# Patient Record
Sex: Male | Born: 1935 | Race: White | Hispanic: No | Marital: Married | Smoking: Former smoker
Health system: Southern US, Community
[De-identification: ages and names within clinical notes are randomized; demographics above are authoritative.]

## PROBLEM LIST (undated history)

## (undated) DIAGNOSIS — F0390 Unspecified dementia without behavioral disturbance: Secondary | ICD-10-CM

## (undated) DIAGNOSIS — I714 Abdominal aortic aneurysm, without rupture, unspecified: Secondary | ICD-10-CM

## (undated) DIAGNOSIS — I739 Peripheral vascular disease, unspecified: Secondary | ICD-10-CM

## (undated) DIAGNOSIS — G20A1 Parkinson's disease without dyskinesia, without mention of fluctuations: Secondary | ICD-10-CM

## (undated) DIAGNOSIS — I10 Essential (primary) hypertension: Secondary | ICD-10-CM

## (undated) DIAGNOSIS — E785 Hyperlipidemia, unspecified: Secondary | ICD-10-CM

## (undated) DIAGNOSIS — I2510 Atherosclerotic heart disease of native coronary artery without angina pectoris: Secondary | ICD-10-CM

## (undated) DIAGNOSIS — G20 Parkinson's disease: Secondary | ICD-10-CM

## (undated) HISTORY — DX: Essential (primary) hypertension: I10

## (undated) HISTORY — DX: Hyperlipidemia, unspecified: E78.5

## (undated) HISTORY — DX: Atherosclerotic heart disease of native coronary artery without angina pectoris: I25.10

## (undated) HISTORY — DX: Peripheral vascular disease, unspecified: I73.9

## (undated) HISTORY — DX: Abdominal aortic aneurysm, without rupture, unspecified: I71.40

## (undated) HISTORY — PX: ABDOMINAL AORTIC ANEURYSM REPAIR: SUR1152

## (undated) HISTORY — DX: Abdominal aortic aneurysm, without rupture: I71.4

---

## 2004-01-28 ENCOUNTER — Ambulatory Visit: Payer: Self-pay | Admitting: Internal Medicine

## 2004-01-29 ENCOUNTER — Ambulatory Visit: Payer: Self-pay | Admitting: *Deleted

## 2004-02-01 ENCOUNTER — Ambulatory Visit: Payer: Self-pay | Admitting: *Deleted

## 2004-12-30 ENCOUNTER — Ambulatory Visit: Payer: Self-pay | Admitting: *Deleted

## 2005-02-06 ENCOUNTER — Ambulatory Visit: Payer: Self-pay | Admitting: *Deleted

## 2005-08-26 ENCOUNTER — Emergency Department (HOSPITAL_COMMUNITY): Admission: EM | Admit: 2005-08-26 | Discharge: 2005-08-26 | Payer: Self-pay | Admitting: Emergency Medicine

## 2006-01-26 ENCOUNTER — Ambulatory Visit: Payer: Self-pay | Admitting: *Deleted

## 2006-02-04 ENCOUNTER — Ambulatory Visit: Payer: Self-pay

## 2007-01-03 ENCOUNTER — Ambulatory Visit: Payer: Self-pay | Admitting: Cardiology

## 2008-01-10 ENCOUNTER — Ambulatory Visit: Payer: Self-pay | Admitting: Cardiology

## 2009-03-25 ENCOUNTER — Ambulatory Visit: Payer: Self-pay | Admitting: Cardiology

## 2009-03-25 DIAGNOSIS — I10 Essential (primary) hypertension: Secondary | ICD-10-CM

## 2009-03-25 DIAGNOSIS — I2510 Atherosclerotic heart disease of native coronary artery without angina pectoris: Secondary | ICD-10-CM

## 2009-03-25 DIAGNOSIS — R259 Unspecified abnormal involuntary movements: Secondary | ICD-10-CM | POA: Insufficient documentation

## 2009-03-25 DIAGNOSIS — E785 Hyperlipidemia, unspecified: Secondary | ICD-10-CM

## 2009-03-26 ENCOUNTER — Telehealth: Payer: Self-pay | Admitting: Cardiology

## 2009-06-12 ENCOUNTER — Encounter: Payer: Self-pay | Admitting: Cardiology

## 2009-10-11 ENCOUNTER — Encounter: Payer: Self-pay | Admitting: Cardiology

## 2010-03-27 ENCOUNTER — Encounter: Payer: Self-pay | Admitting: Cardiology

## 2010-03-27 ENCOUNTER — Ambulatory Visit
Admission: RE | Admit: 2010-03-27 | Discharge: 2010-03-27 | Payer: Self-pay | Source: Home / Self Care | Attending: Cardiology | Admitting: Cardiology

## 2010-04-01 ENCOUNTER — Other Ambulatory Visit: Payer: Self-pay | Admitting: Cardiology

## 2010-04-01 ENCOUNTER — Ambulatory Visit
Admission: RE | Admit: 2010-04-01 | Discharge: 2010-04-01 | Payer: Self-pay | Source: Home / Self Care | Attending: Cardiology | Admitting: Cardiology

## 2010-04-01 LAB — LIPID PANEL
HDL: 34.3 mg/dL — ABNORMAL LOW (ref >39.00)
LDL Cholesterol: 70 mg/dL (ref 0–99)
Triglycerides: 123 mg/dL (ref 0.0–149.0)

## 2010-04-03 NOTE — Progress Notes (Signed)
Summary: pt needs referal   Phone Note Call from Patient Call back at Home Phone [phone]   Caller: Spouse Reason for Call: Talk to Nurse, Talk to Doctor Summary of Call: The MD that the patient was refered to for the shaking of his hands wont schedule appt without referal and records from Dr. Antoine Poche spouse will be leaving at 11:30 this morning please try to call prior to that she wants to talk to you Initial call taken by: Omer Jack,  March 26, 2009 8:37 AM  Follow-up for Phone Call        left hand tremor/shuffling feet and memory bad.  Alexander Novak (wife) would like for scheduler to talk to her about the appointment do to pt's poor memory   Follow-up by: Charolotte Capuchin, RN,  March 26, 2009 9:23 AM

## 2010-04-03 NOTE — Assessment & Plan Note (Signed)
Summary: yearly/sl  Medications Added VITAMIN C 1000 MG TABS (ASCORBIC ACID) 4  by mouth daily FISH OIL   OIL (FISH OIL) 2 by mouth daily ASPIRIN 81 MG  TABS (ASPIRIN) 1 by mouth daily AZILECT 1 MG TABS (RASAGILINE MESYLATE) 1 by mouth daily NAMENDA 10 MG TABS (MEMANTINE HCL) 2 by mouth daily      Allergies Added: NKDA  Visit Type:  Follow-up Primary Provider:  Dr. Purnell Shoemaker  CC:  CAD.  History of Present Illness: The patient returns for yearly followup. Since I last saw him he has done well. He denies any cardiovascular symptoms. He has had no chest pressure, neck or arm discomfort. He has had no palpitations, presyncope or syncope. He has had no shortness of breath, PND or orthopnea. He denies weight gain or edema.  Current Medications (verified): 1)  Simvastatin 40 Mg Tabs (Simvastatin) .Marland Kitchen.. 1 By Mouth Daily 2)  Vitamin D 1000 Unit  Tabs (Cholecalciferol) .... 2 By Mouth Daily 3)  Lysine Hcl 1000 Mg Tabs (Lysine Hcl) .... 2 By Mouth Daily 4)  Magnesium 250 Mg Tabs (Magnesium) .Marland Kitchen.. 1 By Mouth Daily 5)  Vitamin E 400 Unit Caps (Vitamin E) .Marland Kitchen.. 1 By Mouth Daily 6)  Ginkoba 40 Mg Tabs (Ginkgo Biloba) .... Daily 7)  Co Q-10 30 Mg  Caps (Coenzyme Q10) .Marland Kitchen.. 1 By Mouth Daily 8)  L- Carnitine .Marland Kitchen.. 1 By Mouth Daily 9)  Fish Oil   Oil (Fish Oil) .Marland Kitchen.. 1 By Mouth Daily 10)  Vitamin B Complex-C   Caps (B Complex-C) .Marland Kitchen.. 1 By Mouth Daily 11)  Vitamin C 1000 Mg Tabs (Ascorbic Acid) .... 4  By Mouth Daily 12)  Fish Oil   Oil (Fish Oil) .... 2 By Mouth Daily 13)  Aspirin 81 Mg  Tabs (Aspirin) .Marland Kitchen.. 1 By Mouth Daily 14)  Azilect 1 Mg Tabs (Rasagiline Mesylate) .Marland Kitchen.. 1 By Mouth Daily 15)  Namenda 10 Mg Tabs (Memantine Hcl) .... 2 By Mouth Daily  Allergies (verified): No Known Drug Allergies  Past History:  Past Medical History: Reviewed history from 03/25/2009 and no changes required. 1. Coronary artery disease (acute inferior myocardial infarction in     1992.  He had a PTCA and was treated  for recurrent symptoms in     1997.  He had a distal occluded AV circumflex.  He had proximal 95%     stenosis in the circumflex, which was treated with angioplasty). 2. Bilateral femoral artery occlusion, managed medically. 3. Hypertension. 4. Hyperlipidemia.  Past Surgical History: Reviewed history from 03/25/2009 and no changes required.  Abdominal aortic aneurysm, repaired by Dr. Madilyn Fireman.   Review of Systems       As stated in the HPI and negative for all other systems.   Vital Signs:  Patient profile:   77 year old male Height:      70 inches Weight:      195 pounds BMI:     28.08 Pulse rate:   76 / minute Resp:     16 per minute BP sitting:   132 / 74  (right arm)  Vitals Entered By: Marrion Coy, CNA (March 27, 2010 2:19 PM)  Physical Exam  General:  Well developed, well nourished, in no acute distress. Head:  normocephalic and atraumatic Eyes:  PERRLA/EOM intact; conjunctiva and lids normal. Mouth:  edentulous, gums and palate normal. Oral mucosa normal. Neck:  Neck supple, no JVD. No masses, thyromegaly or abnormal cervical nodes. Chest Wall:  no deformities  Lungs:  Clear bilaterally to auscultation and percussion. Abdomen:  Well-healed surgical scar with slight surgical scar hernia, positive bowel sounds, no rebound, guarding, no hepatomegaly. Msk:  Back normal, normal gait. Muscle strength and tone normal. Extremities:  No clubbing or cyanosis. Neurologic:  Alert and oriented x 3. Skin:  Intact without lesions or rashes. Cervical Nodes:  no significant adenopathy Inguinal Nodes:  no significant adenopathy Psych:  Normal affect.   Detailed Cardiovascular Exam  Neck    Carotids: Carotids full and equal bilaterally without bruits.      Neck Veins: Normal, no JVD.    Heart    Inspection: no deformities or lifts noted.      Palpation: normal PMI with no thrills palpable.      Auscultation: regular rate and rhythm, S1, S2 without murmurs, rubs, gallops,  or clicks.    Vascular    Abdominal Aorta: no palpable masses, pulsations, or audible bruits.      Femoral Pulses: normal femoral pulses bilaterally.      Radial Pulses: normal radial pulses bilaterally.      Peripheral Circulation: no clubbing, cyanosis, or edema noted with normal capillary refill.     EKG  Procedure date:  03/27/2010  Findings:      sinus rhythm, rate 76, axis within normal limits, inferolateral Q waves, old infarct, inferior T-wave inversion  Impression & Recommendations:  Problem # 1:  CORONARY ATHEROSCLEROSIS NATIVE CORONARY ARTERY (ICD-414.01) He is doing well and has no ongoing angina. No change in therapy is indicated.  Problem # 2:  DYSLIPIDEMIA (ICD-272.4) I will check a lipid profile with a goal LDL less than 100 HDL greater than 40.  Problem # 3:  ESSENTIAL HYPERTENSION, BENIGN (ICD-401.1) His blood pressure is 12 and medicines as listed.  Patient Instructions: 1)  Your physician recommends that you return for a FASTING lipid profile: 414.01 2)  Your physician wants you to follow-up in: 1 year with Dr Neena Rhymes 2013)   You will receive a reminder letter in the mail two months in advance. If you don't receive a letter, please call our office to schedule the follow-up appointment.

## 2010-04-03 NOTE — Letter (Signed)
Summary: [facility]   [facility]   Imported By: Roderic Ovens 10/21/2009 10:23:41  _____________________________________________________________________  External Attachment:    Type:   Image     Comment:   External Document

## 2010-04-03 NOTE — Letter (Signed)
Summary: [facility]   [facility]   Imported By: Roderic Ovens 06/19/2009 10:00:11  _____________________________________________________________________  External Attachment:    Type:   Image     Comment:   External Document

## 2010-04-03 NOTE — Assessment & Plan Note (Signed)
Summary: rov per pt call/lg  Medications Added VITAMIN D 1000 UNIT  TABS (CHOLECALCIFEROL) 2 by mouth daily LYSINE HCL 1000 MG TABS (LYSINE HCL) 2 by mouth daily MAGNESIUM 250 MG TABS (MAGNESIUM) 1 by mouth daily VITAMIN E 400 UNIT CAPS (VITAMIN E) 1 by mouth daily GINKOBA 40 MG TABS (GINKGO BILOBA) daily CO Q-10 30 MG  CAPS (COENZYME Q10) 1 by mouth daily * L- CARNITINE 1 by mouth daily FISH OIL   OIL (FISH OIL) 1 by mouth daily VITAMIN B COMPLEX-C   CAPS (B COMPLEX-C) 1 by mouth daily      Allergies Added: NKDA  Visit Type:  Follow-up Primary Provider:  Dr. Purnell Shoemaker  CC:  CAD.  History of Present Illness: The patient sensory followup. It has been one year since his last appointment. His last stress test was in 2007 demonstrating an old infarct but no ischemia. He has done quite well over the past year. He does some activities and says he could walk a football field or climb stairs without chest pain or shortness of breath. He has had no palpitations, presyncope or syncope. He has had no PND or orthopnea. He has had no weight gain or swelling. He does have gas and burping and has seen primary care physicians about this and treats this with over-the-counter medications.  Current Medications (verified): 1)  Simvastatin 40 Mg Tabs (Simvastatin) .Marland Kitchen.. 1 By Mouth Daily 2)  Vitamin D 1000 Unit  Tabs (Cholecalciferol) .... 2 By Mouth Daily 3)  Lysine Hcl 1000 Mg Tabs (Lysine Hcl) .... 2 By Mouth Daily 4)  Magnesium 250 Mg Tabs (Magnesium) .Marland Kitchen.. 1 By Mouth Daily 5)  Vitamin E 400 Unit Caps (Vitamin E) .Marland Kitchen.. 1 By Mouth Daily 6)  Ginkoba 40 Mg Tabs (Ginkgo Biloba) .... Daily 7)  Co Q-10 30 Mg  Caps (Coenzyme Q10) .Marland Kitchen.. 1 By Mouth Daily 8)  L- Carnitine .Marland Kitchen.. 1 By Mouth Daily 9)  Fish Oil   Oil (Fish Oil) .Marland Kitchen.. 1 By Mouth Daily 10)  Vitamin B Complex-C   Caps (B Complex-C) .Marland Kitchen.. 1 By Mouth Daily  Allergies (verified): No Known Drug Allergies  Past History:  Past Medical History: 1. Coronary  artery disease (acute inferior myocardial infarction in     1992.  He had a PTCA and was treated for recurrent symptoms in     1997.  He had a distal occluded AV circumflex.  He had proximal 95%     stenosis in the circumflex, which was treated with angioplasty). 2. Bilateral femoral artery occlusion, managed medically. 3. Hypertension. 4. Hyperlipidemia.  Past Surgical History:  Abdominal aortic aneurysm, repaired by Dr. Madilyn Fireman.   Review of Systems       Positve for fine resting tremor, umbilical hernia his previous incision site. Otherwise as stated in the history of present illness negative for all other systems.  Vital Signs:  Patient profile:   77 year old male Height:      70 inches Weight:      198 pounds BMI:     28.51 Pulse rate:   65 / minute Resp:     16 per minute BP sitting:   135 / 81  (right arm)  Vitals Entered By: Marrion Coy, CNA (March 25, 2009 3:47 PM)  Physical Exam  General:  Well developed, well nourished, in no acute distress. Head:  normocephalic and atraumatic Eyes:  PERRLA/EOM intact; conjunctiva and lids normal. Mouth:  edentulous, gums and palate normal. Oral mucosa normal. Neck:  Neck supple, no JVD. No masses, thyromegaly or abnormal cervical nodes. Chest Wall:  no deformities or breast masses noted Lungs:  Clear bilaterally to auscultation and percussion. Abdomen:  Well-healed surgical scar with slight surgical scar hernia, positive bowel sounds, no rebound, guarding, no hepatomegaly. Msk:  Back normal, normal gait. Muscle strength and tone normal. Extremities:  No clubbing or cyanosis. Neurologic:  Alert and oriented x 3. Skin:  Intact without lesions or rashes. Cervical Nodes:  no significant adenopathy Axillary Nodes:  no significant adenopathy Inguinal Nodes:  no significant adenopathy Psych:  Normal affect.   Detailed Cardiovascular Exam  Neck    Carotids: Carotids full and equal bilaterally without bruits.      Neck Veins:  Normal, no JVD.    Heart    Inspection: no deformities or lifts noted.      Palpation: normal PMI with no thrills palpable.      Auscultation: regular rate and rhythm, S1, S2 without murmurs, rubs, gallops, or clicks.    Vascular    Abdominal Aorta: no palpable masses, pulsations, or audible bruits.      Femoral Pulses: normal femoral pulses bilaterally.      Pedal Pulses: normal pedal pulses bilaterally.      Radial Pulses: normal radial pulses bilaterally.      Peripheral Circulation: no clubbing, cyanosis, or edema noted with normal capillary refill.     Impression & Recommendations:  Problem # 1:  CORONARY ATHEROSCLEROSIS NATIVE CORONARY ARTERY (ICD-414.01) The patient has had no new symptoms. No further cardiovascular testing is suggested. He should continue with risk reduction. Toward that end I suggested more exercise as he is not routinely doing this. Orders: EKG w/ Interpretation (93000)  Problem # 2:  DYSLIPIDEMIA (ICD-272.4) I will contact his primary care physician to see his last lipid profile. The goal should be an LDL less than 100 and HDL greater than 40.  Problem # 3:  ESSENTIAL HYPERTENSION, BENIGN (ICD-401.1) His blood pressure is controlled and he will continue the meds as listed.  Problem # 4:  TREMOR (ICD-781.0) Assessment: New The patient has a slight tremor. He requested referral to a neurologist and I have given him the name and telephone number of the neurology group  Patient Instructions: 1)  Your physician recommends that you schedule a follow-up appointment in: 1 year with Dr Kingfisher Lions 2)  Your physician recommends that you continue on your current medications as directed. Please refer to the Current Medication list given to you today.

## 2010-04-04 ENCOUNTER — Encounter: Payer: Self-pay | Admitting: Cardiology

## 2010-04-09 NOTE — Letter (Signed)
Summary: Custom - Lipid  [facility], Main Office  [address]   [place], [place] [address]   Phone: [phone]  Fax: [phone]       April 04, 2010 MRN: [id number]   [facility] [address] [place], [place]  [address]   Dear Mr. [facility],  We have reviewed your cholesterol results.  They are as follows:     Total Cholesterol:    129 (Desirable: less than 200)       HDL  Cholesterol:     34.30  (Desirable: greater than 40 for men and 50 for women)       LDL Cholesterol:       70  (Desirable: less than 100 for low risk and less than 70 for moderate to high risk)       Triglycerides:       123.0  (Desirable: less than 150)  Our recommendations include:These look good except your good cholestrol (HDL) is still low.   Please increase your physical activity as much as possible to help increase this.   Call our office at the number listed above if you have any questions.  Lowering your LDL cholesterol is important, but it is only one of a large number of "risk factors" that may indicate that you are at risk for heart disease, stroke or other complications of hardening of the arteries.  Other risk factors include:   A.  Cigarette Smoking* B.  High Blood Pressure* C.  Obesity* D.   Low HDL Cholesterol (see yours above)* E.   Diabetes Mellitus (higher risk if your is uncontrolled) F.  Family history of premature heart disease G.  Previous history of stroke or cardiovascular disease    *These are risk factors YOU HAVE CONTROL OVER.  For more information, visit .        There is now evidence that lowering the TOTAL CHOLESTEROL AND LDL CHOLESTEROL can reduce the risk of heart disease.  The American Heart Association recommends the following guidelines for the treatment of elevated cholesterol:  1.  If there is now current heart disease and less than two risk factors, TOTAL CHOLESTEROL should be less than 200 and LDL CHOLESTEROL should be less than  100. 2.  If there is current heart disease or two or more risk factors, TOTAL CHOLESTEROL should be less than 200 and LDL CHOLESTEROL should be less than 70.  A diet low in cholesterol, saturated fat, and calories is the cornerstone of treatment for elevated cholesterol.  Cessation of smoking and exercise are also important in the management of elevated cholesterol and preventing vascular disease.  Studies have shown that 30 to 60 minutes of physical activity most days can help lower blood pressure, lower cholesterol, and keep your weight at a healthy level.  Drug therapy is used when cholesterol levels do not respond to therapeutic lifestyle changes (smoking cessation, diet, and exercise) and remains unacceptably high.  If medication is started, it is important to have you levels checked periodically to evaluate the need for further treatment options.    Thank you,     Avie Arenas, RN for Dr. Rollene Rotunda [facility] Team

## 2010-04-16 ENCOUNTER — Encounter: Payer: Self-pay | Admitting: Cardiology

## 2010-05-08 NOTE — Progress Notes (Signed)
Summary: [facility]: Office Visit  [facility]: Office Visit   Imported By: Earl Many 04/29/2010 17:13:03  _____________________________________________________________________  External Attachment:    Type:   Image     Comment:   External Document

## 2010-06-15 ENCOUNTER — Other Ambulatory Visit: Payer: Self-pay | Admitting: Cardiology

## 2010-07-15 NOTE — Assessment & Plan Note (Signed)
Alexander Novak                            CARDIOLOGY OFFICE NOTE   Alexander Novak, Alexander Novak                     MRN:          [id number]  DATE:01/10/2008                            DOB:          [birth year]    PRIMARY CARE PHYSICIAN:  Lianne Bushy, MD   REASON FOR PRESENTATION:  Evaluate the patient with coronary artery  disease.   HISTORY OF PRESENT ILLNESS:  The patient is now 77 years old.  He  presents for followup of the above.  Since I last saw him, he has done  well.  He remains active in his yard.  With this level of activity and  some walking that he does, he does not get any chest pressure, neck or  arm discomfort.  He does not have any significant shortness of breath.  He denies any PND or orthopnea.  He has no palpitations, presyncope or  syncope.  He does get occasional pain in his left greater than right  calf with walking, but this seems to go away the more he walks.  This  has been a chronic problem.   PAST MEDICAL HISTORY:  1. Coronary artery disease (acute inferior myocardial infarction in      1992.  He had a PTCA and was treated for recurrent symptoms in      1997.  He had a distal occluded AV circumflex.  He had proximal 95%      stenosis in the circumflex, which was treated with angioplasty).  2. Bilateral femoral artery occlusion, managed medically.  3. Hypertension.  4. Hyperlipidemia.  5. Abdominal aortic aneurysm, repaired by Dr. Madilyn Fireman.   ALLERGIES:  None.   MEDICATIONS:  Aspirin 81 mg daily, vitamin C, vitamin E, ginkgo biloba,  L-Lysine, magnesium, coenzyme Q, fish oil, simvastatin 40 mg daily, L-  carnitine, and B12.   REVIEW OF SYSTEMS:  As stated in the HPI and otherwise negative for all  other systems.   PHYSICAL EXAMINATION:  GENERAL:  The patient is pleasant and in no  distress.  VITAL SIGNS:  Blood pressure 124/75, heart rate 88 and regular, weight  198 pounds, body mass index 27.  HEENT:  Eyelids  unremarkable; pupils equal, round, and reactive to  light; fundi not visualized; oral mucosa unremarkable.  NECK:  No jugular venous distention at 45 degrees; carotid upstroke  brisk and symmetric; no bruits, no thyromegaly.  LYMPHATICS:  No cervical, axillary, or inguinal nodes.  LUNGS:  Clear to auscultation bilaterally.  BACK:  No costovertebral angle tenderness.  CHEST:  Unremarkable.  HEART:  PMI not displaced or sustained; S1 and S2 within normal limits;  no S3, no S4; no clicks, no rubs, no murmurs.  ABDOMEN:  Well-healed midline scar; ventral hernia; positive bowel  sounds, normal in frequency and pitch; no bruits, no rebound, no  guarding; no midline pulsatile mass; no hepatomegaly, no splenomegaly.  SKIN:  No rashes, no nodules.  EXTREMITIES:  2+ pulses upper, absent femorals, popliteals bilaterally;  1+ dorsalis pedis and posterior tibialis on the left, absent dorsalis  pedis and posterior tibialis on the right; no edema,  no cyanosis, no  clubbing.  NEUROLOGIC:  Oriented to person, place, and time; cranial nerves II  through XII grossly intact; motor grossly intact.   EKG (done at Dr. Harlene Salts office) sinus rhythm, rate 60, axis within  normal limits, intervals within normal limits, old inferior infarct.   ASSESSMENT AND PLAN:  1. Coronary artery disease.  No further cardiovascular testing is      suggested.  He had a stress perfusion study done in 2007.  No      further cardiovascular testing is suggested.  He will continue with      risk reduction.  2. Peripheral vascular disease.  He has some stable claudication.  He      will continue with medical management.  3. Dyslipidemia.  He has a reasonable lipid profile, though his HDL      was a little low.  We discussed this.  He would continue the same      medications as listed and see if he could do a little more      exercise.  4. Hypertension.  Blood pressure is well controlled, and he will      continue the  medications as listed.  5. Followup.  I will see him back in 1 year or sooner if needed.     Rollene Rotunda, MD, [facility]  Electronically Signed    JH/MedQ  DD: 01/10/2008  DT: 01/11/2008  Job #: [id number]   cc:   Lianne Bushy, M.D.

## 2010-07-15 NOTE — Assessment & Plan Note (Signed)
[facility]                            CARDIOLOGY OFFICE NOTE   CYNTHIA, STAINBACK                     MRN:          [id number]  DATE:01/03/2007                            DOB:          10-13-1925    PRIMARY CARE PHYSICIAN:  Lianne Bushy, M.D.   REASON FOR PRESENTATION:  Evaluate patient with coronary artery disease.   HISTORY OF PRESENT ILLNESS:  The patient is a 77 year old gentleman with  a history of coronary disease as described below. He has done well since  he was last seen by Dr. Corinda Gubler. He did have a stress perfusion study  last year that demonstrated an old inferior infarct. There was mild  apical ischemia. This was felt to be unchanged.   The patient has done quite well. He remains active. He has been walking  on a treadmill. With this, he denies any chest discomfort, neck or arm  discomfort. He has not felt any palpitations, pre-syncope or syncope. He  denies any PND or orthopnea.   PAST MEDICAL HISTORY:  1. Coronary artery disease (acute inferior myocardial infarction in      1992, included a percutaneous transluminal coronary angioplasty      which was treated with angioplasty with recurrent symptoms in 1997.      He had a distal occluded AV circumflex. He had a proximal 95%      stenosis which was again treated with angioplasty.)  2. Bilateral femoral artery occlusion, managed medically.  3. Hypertension.  4. Hyperlipidemia.  5. Abdominal aortic aneurysm status post repair January 1996, by Dr.      Madilyn Fireman.   ALLERGIES:  None.   MEDICATIONS:  1. Aspirin 81 mg daily.  2. Vitamin C.  3. Vitamin E.  4. Gingko biloba.  5. L-lysine.  6. Magnesium.  7. Co-enzyme Q.  8. Fish oil.  9. Simvastatin 40 mg daily.  10.El carnitine 500 mg daily.   REVIEW OF SYSTEMS:  As stated in the HPI and otherwise negative for  other systems.   PHYSICAL EXAMINATION:  The patient is in no distress. Blood pressure  1580/82, heart rate 58 and  regular. Weight 198 pounds. Body Mass Index  is 27.  HEENT: Eyelids unremarkable. Pupils equal, round, and reactive to light.  Fundi not visualized. Oral mucosa is unremarkable.  NECK: No jugular venous distention at 45 degrees. Carotid upstroke brisk  and symmetrical. No bruits, no thyromegaly.  LYMPHATICS: No cervical, axillary or inguinal adenopathy.  LUNGS: Clear to auscultation bilaterally.  BACK: No costovertebral angle tenderness.  CHEST: Unremarkable.  HEART: PMI not displaced or sustained. S1, S2 within normal limits. No  S3. No S4. No clicks, rub or murmurs.  ABDOMEN: Flat, positive bowel sounds, normal in frequency and pitch. No  bruits. No rebounds. No guarding. No midline pulsatile mass. No  organomegaly.  SKIN: No rashes, no nodules.  EXTREMITIES: 2+ pulses. No edema.   ASSESSMENT/PLAN:  1. Coronary disease. The patient is having no new symptoms. No further      cardiovascular testing is suggested. He will continue his secondary      risk  reduction.  2. Hypertension. I retook his blood pressure in the office. He is in      the 130's/80's. It seems like that is where he runs. This will be      acceptable blood pressure control and no change will be made to his      medical regimen. I suspect that he is not on a beta-blocker blocker      because of his bradycardia.  3. Dyslipidemia. This is followed by Dr. Purnell Shoemaker. I might suggest      combination therapy as his HDL is 36 and LDL is 83. The goal would      be an HDL greater than 40. The LDL is a target of less than 100      though ideal would be less than 70.  4. Followup. Will see him back yearly.     Rollene Rotunda, MD, [facility]  Electronically Signed    JH/MedQ  DD: 01/03/2007  DT: 01/03/2007  Job #: [id number]   cc:   Lianne Bushy, M.D.

## 2010-07-18 NOTE — Letter (Signed)
January 26, 2006    Lianne Bushy, M.D.  [address]  [place], [place] [address]   RE:  Novak, Alexander  MRN:  [id number]  /  DOB:  April 20, 1925   Dear Michele Mcalpine   It was a pleasure to see your nice patient, Teruo Stilley, for follow up  on January 26, 2006.  As you know, he is 15 years post acute myocardial  infarction, treated with emergency angioplasty of high-grade circumflex  stenosis.  He subsequently had coronary angioplasty of  high-grade  proximal circ. stenosis in 1997 as well.  This was a dominant  circumflex.  The patient has gotten along quite well and has had no  recurrent symptoms.  Eleven years ago, he had abdominal aortic aneurysm  resection and grafting.  Abdominal ultrasound recently revealed no  aneurysm.   The patient also had a history of hyperlipidemia.   He has not been on beta blockers in the past several years I believe  because of his sinus bradycardia with a rate as low as 50 on his visit  in February 06, 2003.   He is very active and continues to feel well.  He is on Zocor, various  vitamins, Co-Q 10 50 daily.   Blood pressure 122/76, pulse 69, normal sinus rhythm.  General  appearance is normal.  JVP is not elevated.  Carotid pulses are palpable  and equal without bruits.  Lungs are clear.  Cardiac exam reveals no  murmur, gallop or rub.  Abdominal exam normal.  Extremities normal.  Pulses palpable and equal bilaterally.   EKG with normal sinus rhythm, old inferolateral myocardial infarction  and this is unchanged.   IMPRESSION:  Diagnoses as noted above.   It has been nearly five years since his most recent Myoview and I have  suggested the patient have this performed in the near future.  If he has  not had recent lipids, then I would also recommend this.   Thank you for the opportunity to share in this nice gentleman's care.  Suggest he return in 12 months or any other time you so desire.    Sincerely,      E. Graceann Congress, MD, [facility]  Electronically Signed    EJL/MedQ  DD: 01/26/2006  DT: 01/27/2006  Job #: [id number]

## 2011-03-31 ENCOUNTER — Encounter: Payer: Self-pay | Admitting: Cardiology

## 2011-04-02 ENCOUNTER — Encounter: Payer: Self-pay | Admitting: Cardiology

## 2011-04-02 ENCOUNTER — Ambulatory Visit (INDEPENDENT_AMBULATORY_CARE_PROVIDER_SITE_OTHER): Payer: Medicare Other | Admitting: Cardiology

## 2011-04-02 DIAGNOSIS — I10 Essential (primary) hypertension: Secondary | ICD-10-CM

## 2011-04-02 DIAGNOSIS — I2510 Atherosclerotic heart disease of native coronary artery without angina pectoris: Secondary | ICD-10-CM

## 2011-04-02 DIAGNOSIS — E785 Hyperlipidemia, unspecified: Secondary | ICD-10-CM

## 2011-04-02 MED ORDER — ASPIRIN EC 81 MG PO TBEC: 81.0000 mg | DELAYED_RELEASE_TABLET | Freq: Every day | ORAL | Status: AC

## 2011-04-02 NOTE — Progress Notes (Signed)
HPI The patient presents for one-year followup. Since last saw him he has had no new cardiovascular complaints. He remains active doing yard work. With this level of activity he denies any chest pressure, neck or arm discomfort. He has no shortness of breath, PND or orthopnea. He denies any palpitations, presyncope or syncope. He has no weight gain or edema. He thinks that he does well because of the nutraceuticals that he uses.  No Known Allergies  Current Outpatient Prescriptions  Medication Sig Dispense Refill  . Ascorbic Acid (VITAMIN C) 1000 MG tablet Take 1,000 mg by mouth daily.      Marland Kitchen b complex vitamins tablet Take 1 tablet by mouth daily.      . Coenzyme Q10 (COQ-10 PO) Take 100 mg by mouth daily.      . fish oil-omega-3 fatty acids 1000 MG capsule Take 2 g by mouth daily.      . Ginkgo 60 MG TABS Take 60 mg by mouth daily.      . LevOCARNitine (CARNITINE PO) Take 500 mg by mouth daily.      Marland Kitchen LYSINE PO Take 1,000 mg by mouth daily.      . Misc Natural Products (ALLERGY RELEAF SYSTEM PO) Take by mouth daily.      . NON FORMULARY Take by mouth daily. procera avh      . Simethicone (GAS-X PO) Take by mouth daily.      . simvastatin (ZOCOR) 40 MG tablet 1 BY MOUTH DAILY  30 tablet  12  . Specialty Vitamins Products (MAGNESIUM, AMINO ACID CHELATE,) 133 MG tablet Take 1 tablet by mouth 2 (two) times daily.      . vitamin E 400 UNIT capsule Take 400 Units by mouth daily.        Past Medical History  Diagnosis Date  . Abdominal aortic aneurysm     repaired by Dr. Madilyn Fireman  . CAD (coronary artery disease)     Acute inferior infarct in 1992. PTCA of her current symptoms in 1997 with a distal occluded AV circumflex. He had a proximal 95% stenosis that was treated with PCI)  . HTN (hypertension)   . Hyperlipidemia   . PVD (peripheral vascular disease)     Bilateral femoral artery occlusion managed medically    Past Surgical History  Procedure Date  . Abdominal aortic aneurysm  repair     ROS:  As stated in the HPI and negative for all other systems.  PHYSICAL EXAM BP 140/70  Pulse 55  Ht 5\' 10"  (1.778 m)  Wt 192 lb (87.091 kg)  BMI 27.55 kg/m2 GENERAL:  Well appearing HEENT:  Pupils equal round and reactive, fundi not visualized, oral mucosa unremarkable, dentures NECK:  No jugular venous distention, waveform within normal limits, carotid upstroke brisk and symmetric, no bruits, no thyromegaly LYMPHATICS:  No cervical, inguinal adenopathy LUNGS:  Clear to auscultation bilaterally BACK:  No CVA tenderness CHEST:  Unremarkable HEART:  PMI not displaced or sustained,S1 and S2 within normal limits, no S3, no S4, no clicks, no rubs, no murmurs ABD:  Flat, positive bowel sounds normal in frequency in pitch, no bruits, no rebound, no guarding, no midline pulsatile mass, no hepatomegaly, no splenomegaly EXT:  2 plus pulses upper and diminished lowert, trace lower extremity edema, no cyanosis no clubbing SKIN:  No rashes no nodules NEURO:  Cranial nerves II through XII grossly intact, motor grossly intact throughout, resting tremor PSYCH:  Cognitively intact, oriented to person place and time  EKG:  Sinus rhythm, rate 55, axis within normal limits, intervals within normal limits, old inferior infarct, inferior T-wave inversions unchanged from previous 04/02/2011   ASSESSMENT AND PLAN

## 2011-04-02 NOTE — Assessment & Plan Note (Signed)
He says he has this followed by his primary provider. I will defer to his management with the suggested LDL less than 100 and HDL greater than 40.

## 2011-04-02 NOTE — Assessment & Plan Note (Signed)
He has a great quality-of-life. He's quite functional. He has no symptoms. No further cardiovascular testing is suggested. He will continue with risk reduction.

## 2011-04-02 NOTE — Assessment & Plan Note (Signed)
His blood pressure is well controlled. He'll continue with medications as listed.

## 2011-04-02 NOTE — Patient Instructions (Signed)
The current medical regimen is effective;  continue present plan and medications.  Follow up in 1 year with Dr Hochrein.  You will receive a letter in the mail 2 months before you are due.  Please call us when you receive this letter to schedule your follow up appointment.  

## 2011-06-29 ENCOUNTER — Other Ambulatory Visit: Payer: Self-pay | Admitting: Cardiology

## 2011-06-29 NOTE — Telephone Encounter (Signed)
..   Requested Prescriptions   Pending Prescriptions Disp Refills  . simvastatin (ZOCOR) 40 MG tablet [Pharmacy Med Name: SIMVASTATIN 40 MG TABLET] 30 tablet 11    Sig: 1 BY MOUTH DAILY

## 2012-04-01 ENCOUNTER — Ambulatory Visit (INDEPENDENT_AMBULATORY_CARE_PROVIDER_SITE_OTHER): Payer: Medicare Other | Admitting: Cardiology

## 2012-04-01 ENCOUNTER — Encounter: Payer: Self-pay | Admitting: Cardiology

## 2012-04-01 VITALS — BP 120/68 | HR 62 | Ht 68.5 in | Wt 187.0 lb

## 2012-04-01 DIAGNOSIS — I2510 Atherosclerotic heart disease of native coronary artery without angina pectoris: Secondary | ICD-10-CM

## 2012-04-01 NOTE — Progress Notes (Signed)
HPI The patient presents for followup of CAD. Since last saw him he has had no new cardiovascular complaints. He is not as active as he was as they moved and he no longer has yard work. With this level of activity he denies any chest pressure, neck or arm discomfort. He has no shortness of breath, PND or orthopnea. He denies any palpitations, presyncope or syncope. He has no weight gain or edema. He continues to use a large number of nutraceuticals that he swears by.    No Known Allergies  Current Outpatient Prescriptions  Medication Sig Dispense Refill  . Ascorbic Acid (VITAMIN C) 1000 MG tablet Take 1,000 mg by mouth daily.      Marland Kitchen aspirin EC 81 MG tablet Take 1 tablet (81 mg total) by mouth daily.  150 tablet  2  . b complex vitamins tablet Take 1 tablet by mouth daily.      . Coenzyme Q10 (COQ-10 PO) Take 100 mg by mouth daily.      . fish oil-omega-3 fatty acids 1000 MG capsule Take 2 g by mouth daily.      . Ginkgo 60 MG TABS Take 60 mg by mouth daily.      . LevOCARNitine (CARNITINE PO) Take 500 mg by mouth daily.      Marland Kitchen LYSINE PO Take 1,000 mg by mouth daily.      . Misc Natural Products (ALLERGY RELEAF SYSTEM PO) Take by mouth daily.      . NON FORMULARY Take by mouth daily. procera avh      . Simethicone (GAS-X PO) Take by mouth daily.      . simvastatin (ZOCOR) 40 MG tablet 1 BY MOUTH DAILY  30 tablet  11  . Specialty Vitamins Products (MAGNESIUM, AMINO ACID CHELATE,) 133 MG tablet Take 1 tablet by mouth 2 (two) times daily.      . vitamin E 400 UNIT capsule Take 400 Units by mouth daily.      . AZILECT 1 MG TABS Take one tablet by mouth daily      . donepezil (ARICEPT) 10 MG tablet Take one tablet by mouth daily        Past Medical History  Diagnosis Date  . Abdominal aortic aneurysm     repaired by Dr. Madilyn Fireman  . CAD (coronary artery disease)     Acute inferior infarct in 1992. PTCA of her current symptoms in 1997 with a distal occluded AV circumflex. He had a proximal 95%  stenosis that was treated with PCI)  . HTN (hypertension)   . Hyperlipidemia   . PVD (peripheral vascular disease)     Bilateral femoral artery occlusion managed medically    Past Surgical History  Procedure Date  . Abdominal aortic aneurysm repair     ROS:  As stated in the HPI and negative for all other systems.  PHYSICAL EXAM BP 120/68  Pulse 62  Ht 5' 8.5" (1.74 m)  Wt 187 lb (84.823 kg)  BMI 28.02 kg/m2  SpO2 98% GENERAL:  Well appearing HEENT:  Pupils equal round and reactive, fundi not visualized, oral mucosa unremarkable, dentures NECK:  No jugular venous distention, waveform within normal limits, carotid upstroke brisk and symmetric, no bruits, no thyromegaly LUNGS:  Clear to auscultation bilaterally HEART:  PMI not displaced or sustained,S1 and S2 within normal limits, no S3, no S4, no clicks, no rubs, no murmurs ABD:  Flat, positive bowel sounds normal in frequency in pitch, no bruits, no rebound,  no guarding, no midline pulsatile mass, no hepatomegaly, no splenomegaly, abdominal hernia related to scar EXT:  2 plus pulses upper and diminished lowert, trace lower extremity edema, no cyanosis no clubbing NEURO:  Cranial nerves II through XII grossly intact, motor grossly intact throughout, resting pill rolling tremor   ASSESSMENT AND PLAN   CORONARY ATHEROSCLEROSIS NATIVE CORONARY ARTERY -  He has a great quality-of-life. He's quite functional. We will pursue conservative therapy. No further change in his therapy is indicated for further imaging.  ESSENTIAL HYPERTENSION, BENIGN - His blood pressure is well controlled. He'll continue with medications as listed.  DYSLIPIDEMIA -  He says he has this followed by his primary provider.   VENTRICULAR ECTOPY - The patient has no symptoms related to this. No further evaluation is indicated.

## 2012-04-01 NOTE — Patient Instructions (Addendum)
The current medical regimen is effective;  continue present plan and medications.  Follow up in 1 year with Dr Hochrein.  You will receive a letter in the mail 2 months before you are due.  Please call us when you receive this letter to schedule your follow up appointment.  

## 2012-05-29 ENCOUNTER — Encounter (HOSPITAL_COMMUNITY): Payer: Self-pay | Admitting: Emergency Medicine

## 2012-05-29 ENCOUNTER — Other Ambulatory Visit: Payer: Self-pay | Admitting: Cardiology

## 2012-05-29 ENCOUNTER — Emergency Department (HOSPITAL_COMMUNITY): Payer: Medicare Other

## 2012-05-29 ENCOUNTER — Emergency Department (HOSPITAL_COMMUNITY)
Admission: EM | Admit: 2012-05-29 | Discharge: 2012-05-29 | Disposition: A | Discharge status: Home / Self Care | Payer: Medicare Other | Attending: Emergency Medicine | Admitting: Emergency Medicine

## 2012-05-29 DIAGNOSIS — F0390 Unspecified dementia without behavioral disturbance: Secondary | ICD-10-CM | POA: Insufficient documentation

## 2012-05-29 DIAGNOSIS — Z87891 Personal history of nicotine dependence: Secondary | ICD-10-CM | POA: Insufficient documentation

## 2012-05-29 DIAGNOSIS — I739 Peripheral vascular disease, unspecified: Secondary | ICD-10-CM | POA: Insufficient documentation

## 2012-05-29 DIAGNOSIS — G20 Parkinson's disease: Secondary | ICD-10-CM | POA: Insufficient documentation

## 2012-05-29 DIAGNOSIS — Z7982 Long term (current) use of aspirin: Secondary | ICD-10-CM | POA: Insufficient documentation

## 2012-05-29 DIAGNOSIS — G20A1 Parkinson's disease without dyskinesia, without mention of fluctuations: Secondary | ICD-10-CM | POA: Insufficient documentation

## 2012-05-29 DIAGNOSIS — S92912A Unspecified fracture of left toe(s), initial encounter for closed fracture: Secondary | ICD-10-CM

## 2012-05-29 DIAGNOSIS — M2550 Pain in unspecified joint: Secondary | ICD-10-CM | POA: Insufficient documentation

## 2012-05-29 DIAGNOSIS — E785 Hyperlipidemia, unspecified: Secondary | ICD-10-CM | POA: Insufficient documentation

## 2012-05-29 DIAGNOSIS — Y9389 Activity, other specified: Secondary | ICD-10-CM | POA: Insufficient documentation

## 2012-05-29 DIAGNOSIS — S2239XA Fracture of one rib, unspecified side, initial encounter for closed fracture: Secondary | ICD-10-CM | POA: Insufficient documentation

## 2012-05-29 DIAGNOSIS — W010XXA Fall on same level from slipping, tripping and stumbling without subsequent striking against object, initial encounter: Secondary | ICD-10-CM | POA: Insufficient documentation

## 2012-05-29 DIAGNOSIS — Z8679 Personal history of other diseases of the circulatory system: Secondary | ICD-10-CM | POA: Insufficient documentation

## 2012-05-29 DIAGNOSIS — Y9289 Other specified places as the place of occurrence of the external cause: Secondary | ICD-10-CM | POA: Insufficient documentation

## 2012-05-29 DIAGNOSIS — I10 Essential (primary) hypertension: Secondary | ICD-10-CM | POA: Insufficient documentation

## 2012-05-29 DIAGNOSIS — I2510 Atherosclerotic heart disease of native coronary artery without angina pectoris: Secondary | ICD-10-CM | POA: Insufficient documentation

## 2012-05-29 DIAGNOSIS — S92309A Fracture of unspecified metatarsal bone(s), unspecified foot, initial encounter for closed fracture: Secondary | ICD-10-CM | POA: Insufficient documentation

## 2012-05-29 DIAGNOSIS — Z79899 Other long term (current) drug therapy: Secondary | ICD-10-CM | POA: Insufficient documentation

## 2012-05-29 DIAGNOSIS — S2232XA Fracture of one rib, left side, initial encounter for closed fracture: Secondary | ICD-10-CM

## 2012-05-29 HISTORY — DX: Unspecified dementia, unspecified severity, without behavioral disturbance, psychotic disturbance, mood disturbance, and anxiety: F03.90

## 2012-05-29 HISTORY — DX: Parkinson's disease: G20

## 2012-05-29 HISTORY — DX: Parkinson's disease without dyskinesia, without mention of fluctuations: G20.A1

## 2012-05-29 MED ORDER — ACETAMINOPHEN 325 MG PO TABS
650.0000 mg | ORAL_TABLET | Freq: Once | ORAL | Status: AC
  Administered 2012-05-29: 650 mg via ORAL
  Filled 2012-05-29: qty 2

## 2012-05-29 MED ORDER — TRAMADOL HCL 50 MG PO TABS: 50.0000 mg | ORAL_TABLET | Freq: Four times a day (QID) | ORAL | Status: DC | PRN

## 2012-05-29 NOTE — Progress Notes (Signed)
Orthopedic Tech Progress Note Patient Details:  Alexander Novak 1925/05/02 [id number]  Patient ID: Alexander Novak, male   DOB: [birth year], 79 y.o.   MRN: [id number] rn reported that they would buddy tape toes of patient  Alexander Novak 05/29/2012, 1:09 PM

## 2012-05-29 NOTE — ED Notes (Addendum)
C/o pain to L side since bending over to pick up a piece of paper this morning.  Pt reports he fell 1 week ago and also has L foot pain from fall. Denies urinary complaints.

## 2012-05-29 NOTE — ED Provider Notes (Signed)
History     CSN: [id number]  Arrival date & time 05/29/12  0865   First MD Initiated Contact with Patient 05/29/12 1030      Chief Complaint  Patient presents with  . Back Pain    (Consider location/radiation/quality/duration/timing/severity/associated sxs/prior treatment) HPI  Alexander Novak is a 79 y.o. male with past medical history significant for AAA surgically repaired complaining of pain to left lower mid axillary ribs with acute onset afterbending to pick up a piece of paper this a.m. Patient also reports that he had a slip and fall while going to the bathroom one week ago and has left foot pain from this incident. He denies any head trauma or loss of consciousness at that time. Patient rates his rib pain as 0/10 when sitting still and 8/10 when moving. It is exacerbated by palpation. He denies shortness of breath, nausea vomiting, difficulty ambulating, numbness, paresthesia, urinary frequency, dysuria.  Past Medical History  Diagnosis Date  . Abdominal aortic aneurysm     repaired by Dr. Madilyn Fireman  . CAD (coronary artery disease)     Acute inferior infarct in 1992. PTCA of her current symptoms in 1997 with a distal occluded AV circumflex. He had a proximal 95% stenosis that was treated with PCI)  . HTN (hypertension)   . Hyperlipidemia   . PVD (peripheral vascular disease)     Bilateral femoral artery occlusion managed medically  . Parkinson disease   . Dementia     Past Surgical History  Procedure Laterality Date  . Abdominal aortic aneurysm repair      No family history on file.  History  Substance Use Topics  . Smoking status: Former Smoker    Quit date: 04/02/1979  . Smokeless tobacco: Not on file  . Alcohol Use: No      Review of Systems  Constitutional: Negative for fever.  Respiratory: Negative for cough and shortness of breath.   Cardiovascular: Negative for chest pain.  Gastrointestinal: Negative for nausea, vomiting, abdominal pain and diarrhea.   Musculoskeletal: Positive for arthralgias.  All other systems reviewed and are negative.    Allergies  Review of patient's allergies indicates no known allergies.  Home Medications   Current Outpatient Rx  Name  Route  Sig  Dispense  Refill  . Ascorbic Acid (VITAMIN C) 1000 MG tablet   Oral   Take 2,000 mg by mouth 2 (two) times daily.          Marland Kitchen aspirin EC 81 MG tablet   Oral   Take 81 mg by mouth daily.         . AZILECT 1 MG TABS      Take one tablet by mouth daily         . B Complex-Biotin-FA (B COMPLETE) TABS   Oral   Take 1 tablet by mouth daily.         . Coenzyme Q10 (COQ-10 PO)   Oral   Take 100 mg by mouth daily.         Marland Kitchen donepezil (ARICEPT) 10 MG tablet      Take one tablet by mouth daily         . fish oil-omega-3 fatty acids 1000 MG capsule   Oral   Take 2 g by mouth daily.         . Ginkgo 60 MG TABS   Oral   Take 60 mg by mouth daily.         . LevOCARNitine (  CARNITINE PO)   Oral   Take 500 mg by mouth daily.         Marland Kitchen loratadine (CLARITIN) 10 MG tablet   Oral   Take 10 mg by mouth daily.         Marland Kitchen LYSINE PO   Oral   Take 1,000 mg by mouth daily.         . NON FORMULARY   Oral   Take by mouth daily. procera avh         . Simethicone (GAS-X PO)   Oral   Take by mouth daily.         . simvastatin (ZOCOR) 40 MG tablet      1 BY MOUTH DAILY   30 tablet   11   . Specialty Vitamins Products (MAGNESIUM, AMINO ACID CHELATE,) 133 MG tablet   Oral   Take 1 tablet by mouth 2 (two) times daily.         . Turmeric 500 MG CAPS   Oral   Take 1 capsule by mouth.         . vitamin E 400 UNIT capsule   Oral   Take 400 Units by mouth daily.           BP 137/56  Pulse 60  Temp(Src) 97.5 F (36.4 C) (Oral)  Resp 18  SpO2 97%  Physical Exam  Nursing note and vitals reviewed. Constitutional: He is oriented to person, place, and time. He appears well-developed and well-nourished. No distress.    HENT:  Head: Normocephalic.  Eyes: Conjunctivae and EOM are normal. Pupils are equal, round, and reactive to light.  Cardiovascular: Normal rate.   Reduce lower extremity pulses bilaterally, no pallor, coolness.  Pulmonary/Chest: Effort normal and breath sounds normal. No stridor. No respiratory distress. He has no wheezes. He has no rales. He exhibits tenderness.    Significant reproducible tenderness to palpation of the left lower ribs midaxillary area. No crepitus,ecchymoses or erythema.   Abdominal: He exhibits no distension and no mass. There is no tenderness. There is no rebound and no guarding.  Incisional hernia, reducible  Musculoskeletal: Normal range of motion.       Feet:  Neurological: He is alert and oriented to person, place, and time.  Pill rolling Tremor worse on the left  upper extremity than.   Psychiatric: He has a normal mood and affect.    ED Course  Procedures (including critical care time)  Labs Reviewed - No data to display Dg Ribs Unilateral W/chest Left  05/29/2012  *RADIOLOGY REPORT*  Clinical Data: Left mid axillary rib pain following a fall 10 days ago.  LEFT RIBS AND CHEST - 3+ VIEW  Comparison: None.  Findings: Normal sized heart.  Mildly hyperexpanded lungs with prominent interstitial markings.  Right diaphragmatic eventration. Diffuse osteopenia.  Minimally displaced fracture of the left seventh rib anterolaterally.  No other fractures are visualized. No pneumothorax.  Upper abdominal surgical clips.  IMPRESSION:  1.  Minimally displaced left seventh rib fracture without pneumothorax. 2.  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   Original Report Authenticated By: Beckie Salts, M.D.    Dg Foot Complete Left  05/29/2012  *RADIOLOGY REPORT*  Clinical Data: Fall with pain in the fourth and fifth metatarsal bones.  LEFT FOOT - COMPLETE 3+ VIEW  Comparison: None.  Findings: Three views of the left foot were obtained.  There is a mildly displaced  fracture involving the mid and distal shaft of the  fourth metatarsal bone.  Fracture appears to be mildly comminuted. Fracture does not involve the articulating surfaces.  IMPRESSION: Mild displaced fracture of the fourth metatarsal bone.   Original Report Authenticated By: Richarda Overlie, M.D.      1. Rib fracture, left, closed, initial encounter   2. Toe fracture, left, closed, initial encounter       MDM  MDM Number of Diagnoses or Management Options  LITTLETON HAUB is a 79 y.o. male with (pain starting this a.m. And left foot pain onset 7 days ago status post mechanical slip and fall. X-rays show minimally displaced seventh rib fracture and mild displaced fracture of the fourth metatarsal. I advised the patient and his wife that it is very important to control pain so the  Wrist no splinting and lungs can aerate properly preventing and pneumonia. Patient and wife verbalized her understanding.  Pt initially refused pain medications.  This is a shared visit with the attending physician who personally evaluated the patient and agrees with the care plan.    Filed Vitals:   05/29/12 1023  BP: 137/56  Pulse: 60  Temp: 97.5 F (36.4 C)  TempSrc: Oral  Resp: 18  SpO2: 97%     Pt verbalized understanding and agrees with care plan. Outpatient follow-up and return precautions given.    Discharge Medication List as of 05/29/2012  1:33 PM    START taking these medications   Details  traMADol (ULTRAM) 50 MG tablet Take 1 tablet (50 mg total) by mouth every 6 (six) hours as needed for pain., Starting 05/29/2012, Until Discontinued, [employer], PA-C 05/30/12 [phone]

## 2012-05-30 NOTE — ED Provider Notes (Signed)
Medical screening examination/treatment/procedure(s) were performed by non-physician practitioner and as supervising physician I was immediately available for consultation/collaboration.  Flint Melter, MD 05/30/12 [phone]

## 2012-06-02 ENCOUNTER — Other Ambulatory Visit: Payer: Self-pay | Admitting: Cardiology

## 2012-12-06 ENCOUNTER — Ambulatory Visit (INDEPENDENT_AMBULATORY_CARE_PROVIDER_SITE_OTHER): Payer: Medicare Other | Admitting: Neurology

## 2012-12-06 ENCOUNTER — Encounter: Payer: Self-pay | Admitting: Neurology

## 2012-12-06 VITALS — BP 119/64 | HR 71 | Ht 69.0 in | Wt 195.0 lb

## 2012-12-06 DIAGNOSIS — I2510 Atherosclerotic heart disease of native coronary artery without angina pectoris: Secondary | ICD-10-CM

## 2012-12-06 DIAGNOSIS — E785 Hyperlipidemia, unspecified: Secondary | ICD-10-CM

## 2012-12-06 DIAGNOSIS — R259 Unspecified abnormal involuntary movements: Secondary | ICD-10-CM

## 2012-12-06 DIAGNOSIS — I10 Essential (primary) hypertension: Secondary | ICD-10-CM

## 2012-12-06 MED ORDER — CARBIDOPA-LEVODOPA 25-100 MG PO TABS: 1.0000 | ORAL_TABLET | Freq: Three times a day (TID) | ORAL | Status: DC

## 2012-12-06 NOTE — Progress Notes (Signed)
History of Present Illness :  Alexander Novak is a 79 year old right-handed Caucasian male with his wife at today's clinical visit for memory loss and mild parkinsonism features.  He has past medical history of coronary artery disease, hypertension, hyperlipidemia,  aortic aneurysm repair, presenting with gradual onset of left hands tremor,and some short-term memory trouble.  He tolerated the Azilect well. Switched to Aricept at last visit for his mild memory loss. The [place] was too high in cost. He is still quite active, taking care of his lawn. no gait difficulty. No recent falls.    Patient denies REM sleep disorder, he denied loss of smell, denied constipation, orthostatic hypotension.  MRI of brain has demonstrated atrophy and small vessel disease.  UPDATE 12/06/2012:   Wife reported that he is more confused recently, could not remember today's appointment, and also location, his wife told him to clinic today, reported more forgetful, increased left hand shaking, he denies significant gait difficulty.  Review of Systems  Out of a complete 14 system review, the patient complains of only the following symptoms, and all other reviewed systems are negative.  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 Patient has some college Patient has been married for 57 years to Alexander Novak) and has 4 children. Patient did not list any history about caffeine patient quit smoking in 1979 patient quit alcohol in 1979 patient did not list any use of drugs. Patient is right handed. Inhaled Tobacco Use: former smoker  Family History Patients parents are both deceased cause of fathers death heart problems cause of mothers death not listed ---08-07-22  Patients dad heart disease  Past Medical History High blood pressure Heart disease High cholesterol  Surgical History PTCA    Physical Exam  Neck: supple no  carotid bruits Respiratory: clear to auscultation bilaterally Cardiovascular: regular rate rhythm  Neurologic Exam  Mental Status: pleasant, awake, alert, cooperative to history, talking, and casual conversation. MMSE 26/30, Missed 1 short term items, not oriented to date, day. Cranial Nerves: CN II-XII pupils were equal round reactive to light.   Extraocular movements were full.  Visual fields were full on confrontational test.  Facial sensation and strength were normal.  Uvula tongue were midline.  Head turning and shoulder shrugging were normal and symmetric.  Tongue protrusion into the cheeks strength were normal. Mildly hard of hearing Motor: Left hand resting tremor mild left more than right limb rigidity, early fatiguability.. Sensory: Normal to light touch Coordination: There was no dysmetria noticed. Gait and Station: Narrow based and steady, smooth turning.  Romberg sign: Negative. Ambulated 210 feet in 45 sec.  Reflexes: Deep tendon reflexes: hypoactive and symmetric   Assessment and Plan:  79 yo with gradual onset of parkinsonism, and mild memory trouble, but overall highly functional, tolerating Aricept well. Marland Kitchen MMSE 26/30, Missed 1 short term items,   1. Azilect 1mg  qday 2. Continue Aricept 10mg  qam. Encouraged daily regular exercise. Advised of safety information in the home as well as driving.  3. Sinmet 25/100 tid 4.  RTC in 6 months with Alexander Novak

## 2013-01-03 ENCOUNTER — Other Ambulatory Visit: Payer: Self-pay | Admitting: Neurology

## 2013-01-04 ENCOUNTER — Other Ambulatory Visit: Payer: Self-pay | Admitting: Neurology

## 2013-01-05 ENCOUNTER — Other Ambulatory Visit: Payer: Self-pay | Admitting: Neurology

## 2013-01-05 MED ORDER — DONEPEZIL HCL 10 MG PO TABS: 10.0000 mg | ORAL_TABLET | Freq: Every day | ORAL | Status: DC

## 2013-01-05 MED ORDER — RASAGILINE MESYLATE 1 MG PO TABS: 1.0000 mg | ORAL_TABLET | Freq: Every day | ORAL | Status: DC

## 2013-03-10 ENCOUNTER — Ambulatory Visit (INDEPENDENT_AMBULATORY_CARE_PROVIDER_SITE_OTHER): Payer: Medicare Other | Admitting: Cardiology

## 2013-03-10 ENCOUNTER — Encounter: Payer: Self-pay | Admitting: Cardiology

## 2013-03-10 VITALS — BP 136/73 | HR 60 | Ht 69.0 in | Wt 186.0 lb

## 2013-03-10 DIAGNOSIS — I10 Essential (primary) hypertension: Secondary | ICD-10-CM

## 2013-03-10 DIAGNOSIS — I2510 Atherosclerotic heart disease of native coronary artery without angina pectoris: Secondary | ICD-10-CM

## 2013-03-10 NOTE — Patient Instructions (Signed)
The current medical regimen is effective;  continue present plan and medications.  Follow up in 1 year with Dr Hochrein.  You will receive a letter in the mail 2 months before you are due.  Please call us when you receive this letter to schedule your follow up appointment.  

## 2013-03-10 NOTE — Progress Notes (Signed)
HPI The patient presents for followup of CAD. Since last saw him he has had no new cardiovascular complaints. He is walking every day. With this level of activity he denies any chest pressure, neck or arm discomfort. He has no shortness of breath, PND or orthopnea. He denies any palpitations, presyncope or syncope. He has no weight gain or edema. He does not feel his PVCs.    No Known Allergies  Current Outpatient Prescriptions  Medication Sig Dispense Refill  . Ascorbic Acid (VITAMIN C) 1000 MG tablet Take 2,000 mg by mouth 2 (two) times daily.       Marland Kitchen. aspirin EC 81 MG tablet Take 81 mg by mouth daily.      . B Complex-Biotin-FA (B COMPLETE) TABS Take 1 tablet by mouth daily.      . carbidopa-levodopa (SINEMET IR) 25-100 MG per tablet Take 1 tablet by mouth 3 (three) times daily.  90 tablet  12  . Coenzyme Q10 (COQ-10 PO) Take 100 mg by mouth daily.      Marland Kitchen. donepezil (ARICEPT) 10 MG tablet Take 1 tablet (10 mg total) by mouth daily. Take one tablet by mouth daily  30 tablet  3  . fish oil-omega-3 fatty acids 1000 MG capsule Take 2 g by mouth daily.      . Ginkgo 60 MG TABS Take 60 mg by mouth daily.      . LevOCARNitine (CARNITINE PO) Take 500 mg by mouth daily.      Marland Kitchen. loratadine (CLARITIN) 10 MG tablet Take 10 mg by mouth daily.      Marland Kitchen. LYSINE PO Take 1,000 mg by mouth daily.      . NON FORMULARY Take by mouth daily. procera avh      . rasagiline (AZILECT) 1 MG TABS tablet Take 1 tablet (1 mg total) by mouth daily. Take one tablet by mouth daily  30 tablet  3  . Simethicone (GAS-X PO) Take by mouth daily.      . simvastatin (ZOCOR) 40 MG tablet TAKE 1 TABLET BY MOUTH EVERY DAY  30 tablet  3  . Specialty Vitamins Products (MAGNESIUM, AMINO ACID CHELATE,) 133 MG tablet Take 1 tablet by mouth 2 (two) times daily.      . traMADol (ULTRAM) 50 MG tablet Take 1 tablet (50 mg total) by mouth every 6 (six) hours as needed for pain.  15 tablet  0  . Turmeric 500 MG CAPS Take 1 capsule by mouth.       . vitamin E 400 UNIT capsule Take 400 Units by mouth daily.       No current facility-administered medications for this visit.    Past Medical History  Diagnosis Date  . Abdominal aortic aneurysm     repaired by Dr. Madilyn FiremanHayes  . CAD (coronary artery disease)     Acute inferior infarct in 1992. PTCA of her current symptoms in 1997 with a distal occluded AV circumflex. He had a proximal 95% stenosis that was treated with PCI)  . HTN (hypertension)   . Hyperlipidemia   . PVD (peripheral vascular disease)     Bilateral femoral artery occlusion managed medically  . Parkinson disease   . Dementia     Past Surgical History  Procedure Laterality Date  . Abdominal aortic aneurysm repair      ROS:  As stated in the HPI and negative for all other systems.  PHYSICAL EXAM BP 136/73  Pulse 60  Ht 5\' 9"  (1.753 m)  Wt 186 lb (84.369 kg)  BMI 27.45 kg/m2 GENERAL:  Well appearing HEENT:  Pupils equal round and reactive, fundi not visualized, oral mucosa unremarkable, dentures NECK:  No jugular venous distention, waveform within normal limits, carotid upstroke brisk and symmetric, no bruits, no thyromegaly LUNGS:  Clear to auscultation bilaterally HEART:  PMI not displaced or sustained,S1 and S2 within normal limits, no S3, no S4, no clicks, no rubs, no murmurs ABD:  Flat, positive bowel sounds normal in frequency in pitch, no bruits, no rebound, no guarding, no midline pulsatile mass, no hepatomegaly, no splenomegaly, abdominal hernia related to scar EXT:  2 plus pulses upper and diminished lower, trace lower extremity edema, no cyanosis no clubbing NEURO:  Cranial nerves II through XII grossly intact, motor grossly intact throughout, resting pill rolling tremor  EKG:  Sinus rhythm, rate 60, rightward axis, old inferior infarct, old anterolateral infarct, no acute ST-T wave changes.  03/10/2013   ASSESSMENT AND PLAN   CORONARY ATHEROSCLEROSIS NATIVE CORONARY ARTERY -  The patient has no new  sypmtoms.  No further cardiovascular testing is indicated.  We will continue with aggressive risk reduction and meds as listed.  ESSENTIAL HYPERTENSION, BENIGN - His blood pressure is well controlled. He'll continue with medications as listed.  DYSLIPIDEMIA -  He says he has this followed by Emeterio Reeve, MD  VENTRICULAR ECTOPY - The patient has no symptoms related to this. No further evaluation is indicated.   PVD - I did review some recent lower arterial studies.  He had severe disease in the right distal SFA.  However, his symptoms are treatable with walking.  No change in therapy is indicated.

## 2013-04-30 ENCOUNTER — Other Ambulatory Visit: Payer: Self-pay | Admitting: Neurology

## 2013-04-30 MED ORDER — DONEPEZIL HCL 10 MG PO TABS: 10.0000 mg | ORAL_TABLET | Freq: Every day | ORAL | Status: DC

## 2013-05-01 ENCOUNTER — Other Ambulatory Visit: Payer: Self-pay | Admitting: Neurology

## 2013-07-06 ENCOUNTER — Encounter: Payer: Self-pay | Admitting: Nurse Practitioner

## 2013-07-06 ENCOUNTER — Encounter (INDEPENDENT_AMBULATORY_CARE_PROVIDER_SITE_OTHER): Payer: Self-pay

## 2013-07-06 ENCOUNTER — Ambulatory Visit (INDEPENDENT_AMBULATORY_CARE_PROVIDER_SITE_OTHER): Payer: Medicare Other | Admitting: Nurse Practitioner

## 2013-07-06 ENCOUNTER — Telehealth: Payer: Self-pay | Admitting: Neurology

## 2013-07-06 VITALS — BP 121/66 | HR 56 | Ht 69.0 in | Wt 189.0 lb

## 2013-07-06 DIAGNOSIS — R21 Rash and other nonspecific skin eruption: Secondary | ICD-10-CM

## 2013-07-06 DIAGNOSIS — F0390 Unspecified dementia without behavioral disturbance: Secondary | ICD-10-CM | POA: Insufficient documentation

## 2013-07-06 DIAGNOSIS — R259 Unspecified abnormal involuntary movements: Secondary | ICD-10-CM

## 2013-07-06 DIAGNOSIS — R413 Other amnesia: Secondary | ICD-10-CM

## 2013-07-06 NOTE — Telephone Encounter (Signed)
Pt's wife Sallye OberLouise called back concerning the dermatologist that Sallye OberLouise sees and is referring pt to the same Dr, per NP/CM she will make referral for areas on both legs to Dr. Arminda Residesaniel Jones.  [facility], [phone], Dr. Arminda Residesaniel Jones & wife's apt is 08/09/13 at 10:10. Please call Sallye OberLouise when the apt is made so she can put it on her calendar. Thanks

## 2013-07-06 NOTE — Telephone Encounter (Signed)
Referral already sent

## 2013-07-06 NOTE — Progress Notes (Signed)
[facility]  PATIENT: Alexander Novak DOB: 02/19/1926   REASON FOR VISIT: for memory   HISTORY OF PRESENT ILLNESS: Mr. Alexander Novak, 80 year old male returns for followup. He was last seen in this office by Dr. Terrace Novak 12/06/12. 2014. He has a history of memory loss and mild parkinsonism features. He has past medical history of coronary artery disease, hypertension, hyperlipidemia, aortic aneurysm repair, presenting with gradual onset of left hands tremor,and some short-term memory trouble.  He tolerated the Azilect well. Switched to Aricept  for his mild memory loss. The Nameda was too high in cost. He is still quite active, taking care of his lawn. no gait difficulty. No recent falls.  Patient denies REM sleep disorder, he denied loss of smell, denied constipation, orthostatic hypotension.  MRI of brain has demonstrated atrophy and small vessel disease.    REVIEW OF SYSTEMS: Full 14 system review of systems performed and notable only for those listed, all others are neg:  Constitutional: N/A  Cardiovascular: N/A  Ear/Nose/Throat: Hearing loss  Skin: N/A  Eyes: N/A  Respiratory: Cough Gastroitestinal: N/A  Hematology/Lymphatic: N/A  Endocrine: N/A Musculoskeletal:N/A  Allergy/Immunology: N/A  Neurological: Memory loss, tremors Psychiatric: Anxiety, decreased concentration , agitation at times Sleep : Restless legs  ALLERGIES: No Known Allergies  HOME MEDICATIONS: Outpatient Prescriptions Prior to Visit  Medication Sig Dispense Refill  . Ascorbic Acid (VITAMIN C) 1000 MG tablet Take 2,000 mg by mouth 2 (two) times daily.       Marland Kitchen. aspirin EC 81 MG tablet Take 81 mg by mouth daily.      . AZILECT 1 MG TABS tablet TAKE 1 TABLET BY MOUTH EVERY DAY  90 tablet  1  . AZILECT 1 MG TABS tablet TAKE 1 TABLET BY MOUTH EVERY DAY  30 tablet  6  . B Complex-Biotin-FA (B COMPLETE) TABS Take 1 tablet by mouth daily.      . carbidopa-levodopa (SINEMET IR) 25-100 MG per tablet  Take 1 tablet by mouth 3 (three) times daily.  90 tablet  12  . Coenzyme Q10 (COQ-10 PO) Take 100 mg by mouth daily.      Marland Kitchen. donepezil (ARICEPT) 10 MG tablet Take 1 tablet (10 mg total) by mouth daily.  90 tablet  1  . fish oil-omega-3 fatty acids 1000 MG capsule Take 2 g by mouth daily.      . Ginkgo 60 MG TABS Take 60 mg by mouth daily.      . LevOCARNitine (CARNITINE PO) Take 500 mg by mouth daily.      Marland Kitchen. loratadine (CLARITIN) 10 MG tablet Take 10 mg by mouth daily.      Marland Kitchen. LYSINE PO Take 1,000 mg by mouth daily.      . NON FORMULARY Take by mouth daily. procera avh      . Simethicone (GAS-X PO) Take by mouth daily.      . simvastatin (ZOCOR) 40 MG tablet TAKE 1 TABLET BY MOUTH EVERY DAY  30 tablet  3  . Specialty Vitamins Products (MAGNESIUM, AMINO ACID CHELATE,) 133 MG tablet Take 1 tablet by mouth 2 (two) times daily.      . vitamin E 400 UNIT capsule Take 400 Units by mouth daily.      . traMADol (ULTRAM) 50 MG tablet Take 1 tablet (50 mg total) by mouth every 6 (six) hours as needed for pain.  15 tablet  0  . Turmeric 500 MG CAPS Take 1 capsule by mouth.  No facility-administered medications prior to visit.    PAST MEDICAL HISTORY: Past Medical History  Diagnosis Date  . Abdominal aortic aneurysm     repaired by Dr. Madilyn FiremanHayes  . CAD (coronary artery disease)     Acute inferior infarct in 1992. PTCA of her current symptoms in 1997 with a distal occluded AV circumflex. He had a proximal 95% stenosis that was treated with PCI)  . HTN (hypertension)   . Hyperlipidemia   . PVD (peripheral vascular disease)     Bilateral femoral artery occlusion managed medically  . Parkinson disease   . Dementia     PAST SURGICAL HISTORY: Past Surgical History  Procedure Laterality Date  . Abdominal aortic aneurysm repair      FAMILY HISTORY: Family History  Problem Relation Age of Onset  . Heart disease    . Stroke Father   . Pneumonia Mother     SOCIAL HISTORY: History   Social  History  . Marital Status: Married    Spouse Name: Alexander Novak    Number of Children: 4  . Years of Education: college   Occupational History  .      Retired   Social History Main Topics  . Smoking status: Former Smoker    Quit date: 04/02/1979  . Smokeless tobacco: Never Used  . Alcohol Use: No  . Drug Use: No  . Sexual Activity: Not on file   Other Topics Concern  . Not on file   Social History Narrative   Patient is retired Art gallery managerengineer / Arts administratorComputers. Patient has a college education. Patient is married to Alexander Novak    Caffeine- None    Right handed.           PHYSICAL EXAM  Filed Vitals:   07/06/13 1043  BP: 121/66 TEMP 96.8  Pulse: 56  Height: 5\' 9"  (1.753 m)  Weight: 189 lb (85.73 kg)   Body mass index is 27.9 kg/(m^2).  Generalized: Well developed, in no acute distress  Head: normocephalic and atraumatic,. Oropharynx benign  Neck: Supple, no carotid bruits  Cardiac: Regular rate rhythm, no murmur  Musculoskeletal: No deformity  Skin reddened area from ankle to mid shin both lower extremities, scaling in nature, mild edema noted as well  Neurological examination   Mentation: Alert oriented to time, place, history taking. MMSE 27/30 missing two out of three recall and the day of the week. AFT 9 Follows all commands speech and language fluent  Cranial nerve II-XII: .Pupils were equal round reactive to light extraocular movements were full, visual field were full on confrontational test. Facial sensation and strength were normal. hearing was intact to finger rubbing bilaterally. Uvula tongue midline. head turning and shoulder shrug were normal and symmetric.Tongue protrusion into cheek strength was normal. Motor: Left resting tremor mild left more than right limb rigidity, no focal weakness  Sensory: normal and symmetric to light touch, pinprick, and  vibration in lower extremities Coordination: finger-nose-finger, heel-to-shin bilaterally, no dysmetria Reflexes:  Hypoactive and symmetric,  plantar responses were flexor bilaterally. Gait and Station: Rising up from seated position without assistance, narrow-base  stance,  moderate stride, good arm swing, smooth turning, Tandem gait is steady  DIAGNOSTIC DATA (LABS, IMAGING, TESTING) -  ASSESSMENT AND PLAN  80 y.o. year old male  has a past medical history of Abdominal aortic aneurysm; CAD (coronary artery disease); HTN (hypertension); Hyperlipidemia; PVD (peripheral vascular disease); Parkinson disease; and Dementia. here to followup.  Continue Aricept at the current dose Continue carbidopa levodopa Continue  Azilect Referral to  dermatologist Dr. Arminda Resides, [phone], (wife has appt on 6/10).  Followup in 6 months Nilda Riggs, [facility], [facility], APRN  [facility] [address], [address] [place], [place] [address] [phone]

## 2013-07-06 NOTE — Patient Instructions (Signed)
Continue Aricept at the current dose Continue carbidopa levodopa Continue Azilect Wife to call back with the number of dermatologist and name that she sees, will make referral for areas on both legs Followup in 6 months

## 2013-07-07 ENCOUNTER — Other Ambulatory Visit: Payer: Self-pay | Admitting: Neurology

## 2013-07-11 ENCOUNTER — Other Ambulatory Visit: Payer: Self-pay

## 2013-07-11 MED ORDER — SIMVASTATIN 40 MG PO TABS
ORAL_TABLET | ORAL | Status: DC
Start: 2013-07-11 — End: 2014-07-13

## 2013-10-26 ENCOUNTER — Other Ambulatory Visit: Payer: Self-pay

## 2013-10-26 MED ORDER — DONEPEZIL HCL 10 MG PO TABS: 10.0000 mg | ORAL_TABLET | Freq: Every day | ORAL | Status: DC

## 2013-10-27 ENCOUNTER — Other Ambulatory Visit: Payer: Self-pay | Admitting: Neurology

## 2013-10-31 ENCOUNTER — Other Ambulatory Visit: Payer: Self-pay | Admitting: Neurology

## 2013-12-22 ENCOUNTER — Other Ambulatory Visit: Payer: Self-pay | Admitting: Neurology

## 2014-01-08 ENCOUNTER — Encounter: Payer: Self-pay | Admitting: Adult Health

## 2014-01-08 ENCOUNTER — Ambulatory Visit (INDEPENDENT_AMBULATORY_CARE_PROVIDER_SITE_OTHER): Payer: Medicare Other | Admitting: Adult Health

## 2014-01-08 VITALS — BP 128/66 | HR 64 | Ht 69.0 in | Wt 190.0 lb

## 2014-01-08 DIAGNOSIS — R251 Tremor, unspecified: Secondary | ICD-10-CM

## 2014-01-08 DIAGNOSIS — R413 Other amnesia: Secondary | ICD-10-CM

## 2014-01-08 NOTE — Patient Instructions (Signed)

## 2014-01-08 NOTE — Progress Notes (Signed)
PATIENT: Alexander Novak DOB: 12-05-1925  REASON FOR VISIT: follow up HISTORY FROM: patient  HISTORY OF PRESENT ILLNESS: Alexander Novak is an 80 year old male with a history of memory loss and parkinsonism features. He returns today for follow-up. He is currently taking Aricept and Sinemet and reports that he is tolerating it well. He states that his memory has remained the same. He states that it depends on the situation. His wife agrees with that. She states that if it is something he is interested in then he tends to remember those things. Denies having to give up anything due to his memory but his wife states that it takes him a while to do certain tasks. They downsized to a townhouse and so therefore they no longer have to do yard work. He does have a good appetite. He does like sweets. He states that he exercises daily. He is able to operate a motor vehicle without difficulty. Denies any changes with his gait or balance.  He has a tremor in hands left > right. He  States that he sleeps well at night.   HSITORY 07/06/13 (CM): 80 year old male returns for followup. He was last seen in this office by Dr. Terrace Arabia 12/06/12. 2014. He has a history of memory loss and mild parkinsonism features. He has past medical history of coronary artery disease, hypertension, hyperlipidemia, aortic aneurysm repair, presenting with gradual onset of left hands tremor, and some short-term memory trouble.  He tolerated the Azilect well. Switched to Aricept for his mild memory loss. The [place]a was too high in cost. He is still quite active, taking care of his lawn. no gait difficulty. No recent falls.  Patient denies REM sleep disorder, he denied loss of smell, denied constipation, orthostatic hypotension.  MRI of brain has demonstrated atrophy and small vessel disease  REVIEW OF SYSTEMS: Full 14 system review of systems performed and notable only for:  Constitutional: N/A  Eyes: N/A Ear/Nose/Throat: hearing loss,  drooling Skin: N/A  Cardiovascular: N/A  Respiratory: N/A  Gastrointestinal: N/A  Genitourinary: N/A Hematology/Lymphatic: N/A  Endocrine: N/A Musculoskeletal:N/A  Allergy/Immunology: N/A  Neurological: N/A Psychiatric: N/A Sleep: N/A   ALLERGIES: No Known Allergies  HOME MEDICATIONS: Outpatient Prescriptions Prior to Visit  Medication Sig Dispense Refill  . Ascorbic Acid (VITAMIN C) 1000 MG tablet Take 2,000 mg by mouth 2 (two) times daily.     Marland Kitchen aspirin EC 81 MG tablet Take 81 mg by mouth daily.    . AZILECT 1 MG TABS tablet TAKE 1 TABLET BY MOUTH EVERY DAY 90 tablet 1  . B Complex-Biotin-FA (B COMPLETE) TABS Take 1 tablet by mouth daily.    . carbidopa-levodopa (SINEMET IR) 25-100 MG per tablet TAKE 1 TABLET BY MOUTH 3 TIMES DAILY 90 tablet 10  . Coenzyme Q10 (COQ-10 PO) Take 100 mg by mouth daily.    Marland Kitchen donepezil (ARICEPT) 10 MG tablet Take 1 tablet (10 mg total) by mouth daily. 90 tablet 0  . fish oil-omega-3 fatty acids 1000 MG capsule Take 2 g by mouth daily.    . Ginkgo 60 MG TABS Take 60 mg by mouth daily.    . LevOCARNitine (CARNITINE PO) Take 500 mg by mouth daily.    Marland Kitchen loratadine (CLARITIN) 10 MG tablet Take 10 mg by mouth daily.    Marland Kitchen LYSINE PO Take 1,000 mg by mouth daily.    . NON FORMULARY Take by mouth daily. procera avh    . Simethicone (GAS-X PO) Take  by mouth daily.    . simvastatin (ZOCOR) 40 MG tablet TAKE 1 TABLET BY MOUTH EVERY DAY 90 tablet 3  . Specialty Vitamins Products (MAGNESIUM, AMINO ACID CHELATE,) 133 MG tablet Take 1 tablet by mouth 2 (two) times daily.    . vitamin E 400 UNIT capsule Take 400 Units by mouth daily.     No facility-administered medications prior to visit.    PAST MEDICAL HISTORY: Past Medical History  Diagnosis Date  . Abdominal aortic aneurysm     repaired by Dr. Madilyn FiremanHayes  . CAD (coronary artery disease)     Acute inferior infarct in 1992. PTCA of her current symptoms in 1997 with a distal occluded AV circumflex. He had a  proximal 95% stenosis that was treated with PCI)  . HTN (hypertension)   . Hyperlipidemia   . PVD (peripheral vascular disease)     Bilateral femoral artery occlusion managed medically  . Parkinson disease   . Dementia     PAST SURGICAL HISTORY: Past Surgical History  Procedure Laterality Date  . Abdominal aortic aneurysm repair      FAMILY HISTORY: Family History  Problem Relation Age of Onset  . Heart disease    . Stroke Father   . Pneumonia Mother     SOCIAL HISTORY: History   Social History  . Marital Status: Married    Spouse Name: Alexander Novak    Number of Children: 4  . Years of Education: college   Occupational History  .      Retired   Social History Main Topics  . Smoking status: Former Smoker    Quit date: 04/02/1979  . Smokeless tobacco: Never Used  . Alcohol Use: No  . Drug Use: No  . Sexual Activity: Not on file   Other Topics Concern  . Not on file   Social History Narrative   Patient is retired Art gallery managerengineer / Arts administratorComputers. Patient has a college education. Patient is married to Alexander Novak    Caffeine- None    Right handed.            PHYSICAL EXAM  Filed Vitals:   01/08/14 0845  BP: 128/66  Pulse: 64  Height: 5\' 9"  (1.753 m)  Weight: 190 lb (86.183 kg)   Body mass index is 28.05 kg/(m^2).  Generalized: Well developed, in no acute distress   Neurological examination  Mentation: Alert oriented to time, place, history taking. Follows all commands speech and language fluent. MMSE 27/30 Cranial nerve II-XII: Pupils were equal round reactive to light. Extraocular movements were full, visual field were full on confrontational test. Facial sensation and strength were normal.  Uvula tongue midline. Head turning and shoulder shrug  were normal and symmetric. Motor: The motor testing reveals 5 over 5 strength of all 4 extremities. Good symmetric motor tone is noted throughout. Resting tremor in left hand. Sensory: Sensory testing is intact to soft touch on  all 4 extremities. No evidence of extinction is noted.  Coordination: Cerebellar testing reveals good finger-nose-finger and heel-to-shin bilaterally.  Gait and station: patient is able to stand from a sitting position without assistance. Gait is normal. Tandem gait is normal. Romberg is negative. No drift is seen.  Reflexes: Deep tendon reflexes are symmetric and normal bilaterally.    DIAGNOSTIC DATA (LABS, IMAGING, TESTING) - I reviewed patient records, labs, notes, testing and imaging myself where available.   Lab Results  Component Value Date   CHOL 129 04/01/2010   HDL 34.30* 04/01/2010   LDLCALC 70  04/01/2010   TRIG 123.0 04/01/2010   CHOLHDL 4 04/01/2010    ASSESSMENT AND PLAN 80 y.o. year old male  has a past medical history of Abdominal aortic aneurysm; CAD (coronary artery disease); HTN (hypertension); Hyperlipidemia; PVD (peripheral vascular disease); Parkinson disease; and Dementia. here with:  1. Memory loss 2. Tremor  Patient's memory has remained stable. His MMSE today is 27/30, it was also 27/30 at the previous visit. He continues to take Aricept. The patient does have a tremor in the left hand that is most notable when resting. The patient does feel that the Sinemet has helped his tremor. He states that he is not bothered by the tremor and does not want his medication increased. Patient will continue taking Aricept and Sinemet. If his symptoms worsen or she develops new symptoms he should let us know. Otherwise he should followup in 6 months or sooner if needed.   Butch PennyMegan Yana Schorr, MSN, NP-C 01/08/2014, 9:20 AM [facility] [address], [address] [place], [place] [address] [phone]  Note: This document was prepared with digital dictation and possible smart phrase technology. Any transcriptional errors that result from this process are unintentional.

## 2014-01-09 NOTE — Progress Notes (Signed)
I agree above plan. 

## 2014-01-17 ENCOUNTER — Encounter: Payer: Self-pay | Admitting: Neurology

## 2014-01-23 ENCOUNTER — Encounter: Payer: Self-pay | Admitting: Neurology

## 2014-01-28 ENCOUNTER — Other Ambulatory Visit: Payer: Self-pay

## 2014-01-28 MED ORDER — DONEPEZIL HCL 10 MG PO TABS: 10.0000 mg | ORAL_TABLET | Freq: Every day | ORAL | Status: DC

## 2014-02-02 ENCOUNTER — Other Ambulatory Visit: Payer: Self-pay | Admitting: Neurology

## 2014-03-15 ENCOUNTER — Telehealth: Payer: Self-pay | Admitting: Cardiology

## 2014-03-15 NOTE — Telephone Encounter (Signed)
Home health Nurse Practitioner went to pt's house for annual visit. She noted pulse feels irregular.  Pt has dementia, she asked pt's wife if she was aware of this issue, she stated no.  NP obtained a pulse of 60, describes as strong, BP of 138/80.  Pt/wife deny symptoms.  Pt has annual visit w/ us 04/06/14, will forward to Dr. Antoine PocheHochrein to see if he recommends sooner f/u.

## 2014-04-06 ENCOUNTER — Ambulatory Visit: Payer: Self-pay | Admitting: Cardiology

## 2014-04-17 ENCOUNTER — Encounter: Payer: Self-pay | Admitting: Cardiology

## 2014-04-28 ENCOUNTER — Other Ambulatory Visit: Payer: Self-pay | Admitting: Neurology

## 2014-05-06 ENCOUNTER — Other Ambulatory Visit: Payer: Self-pay | Admitting: Neurology

## 2014-06-11 ENCOUNTER — Encounter: Payer: Self-pay | Admitting: Cardiology

## 2014-07-09 ENCOUNTER — Ambulatory Visit: Payer: Medicare Other | Admitting: Nurse Practitioner

## 2014-07-13 ENCOUNTER — Other Ambulatory Visit: Payer: Self-pay | Admitting: Cardiology

## 2014-07-13 NOTE — Telephone Encounter (Signed)
Rx refill sent to patient pharmacy   

## 2014-07-31 ENCOUNTER — Ambulatory Visit (INDEPENDENT_AMBULATORY_CARE_PROVIDER_SITE_OTHER): Payer: Medicare Other | Admitting: Cardiology

## 2014-07-31 VITALS — BP 118/70 | HR 71 | Ht 70.0 in | Wt 185.0 lb

## 2014-07-31 DIAGNOSIS — I2510 Atherosclerotic heart disease of native coronary artery without angina pectoris: Secondary | ICD-10-CM | POA: Diagnosis not present

## 2014-07-31 NOTE — Progress Notes (Signed)
HPI The patient presents for followup of CAD. Since last saw him he has had no new cardiovascular complaints. He is exercising with weights.  With this level of activity he denies any chest pressure, neck or arm discomfort. He has no shortness of breath, PND or orthopnea. He denies any palpitations, presyncope or syncope. He has no weight gain or edema. He does not feel his PVCs.    No Known Allergies  Current Outpatient Prescriptions  Medication Sig Dispense Refill  . Ascorbic Acid (VITAMIN C) 1000 MG tablet Take 2,000 mg by mouth 2 (two) times daily.     Marland Kitchen aspirin EC 81 MG tablet Take 81 mg by mouth daily.    . AZILECT 1 MG TABS tablet TAKE 1 TABLET BY MOUTH EVERY DAY 90 tablet 1  . B Complex-Biotin-FA (B COMPLETE) TABS Take 1 tablet by mouth daily.    . carbidopa-levodopa (SINEMET IR) 25-100 MG per tablet TAKE 1 TABLET BY MOUTH 3 TIMES DAILY 90 tablet 10  . Coenzyme Q10 (COQ-10 PO) Take 100 mg by mouth daily.    Marland Kitchen donepezil (ARICEPT) 10 MG tablet Take 1 tablet (10 mg total) by mouth daily. 90 tablet 1  . fish oil-omega-3 fatty acids 1000 MG capsule Take 2 g by mouth daily.    . fluocinonide cream (LIDEX) 0.05 %   1  . Ginkgo 60 MG TABS Take 60 mg by mouth daily.    . LevOCARNitine (CARNITINE PO) Take 500 mg by mouth daily.    Marland Kitchen loratadine (CLARITIN) 10 MG tablet Take 10 mg by mouth daily.    . NON FORMULARY Take by mouth daily. procera avh    . Simethicone (GAS-X PO) Take by mouth daily.    . simvastatin (ZOCOR) 40 MG tablet TAKE 1 TABLET BY MOUTH EVERY DAY 30 tablet 0  . Specialty Vitamins Products (MAGNESIUM, AMINO ACID CHELATE,) 133 MG tablet Take 1 tablet by mouth 2 (two) times daily.    . vitamin E 400 UNIT capsule Take 400 Units by mouth daily.     No current facility-administered medications for this visit.    Past Medical History  Diagnosis Date  . Abdominal aortic aneurysm     repaired by Dr. Madilyn Fireman  . CAD (coronary artery disease)     Acute inferior infarct in 1992.  PTCA of her current symptoms in 1997 with a distal occluded AV circumflex. He had a proximal 95% stenosis that was treated with PCI)  . HTN (hypertension)   . Hyperlipidemia   . PVD (peripheral vascular disease)     Bilateral femoral artery occlusion managed medically  . Parkinson disease   . Dementia     Past Surgical History  Procedure Laterality Date  . Abdominal aortic aneurysm repair      ROS:  As stated in the HPI and negative for all other systems.  PHYSICAL EXAM BP 118/70 mmHg  Pulse 71  Ht  (1.778 m)  Wt 185 lb (83.915 kg)  BMI 26.54 kg/m2 GENERAL:  Well appearing HEENT:  Pupils equal round and reactive, fundi not visualized, oral mucosa unremarkable, dentures NECK:  No jugular venous distention, waveform within normal limits, carotid upstroke brisk and symmetric, no bruits, no thyromegaly LUNGS:  Clear to auscultation bilaterally HEART:  PMI not displaced or sustained,S1 and S2 within normal limits, no S3, no S4, no clicks, no rubs, no murmurs ABD:  Flat, positive bowel sounds normal in frequency in pitch, no bruits, no rebound, no guarding, no midline  pulsatile mass, no hepatomegaly, no splenomegaly, abdominal hernia related to scar EXT:  2 plus pulses upper and diminished lower, trace lower extremity edema, no cyanosis no clubbing NEURO:  Cranial nerves II through XII grossly intact, motor grossly intact throughout, resting pill rolling tremor  EKG:  Sinus rhythm, rate 71, rightward axis, old inferior infarct, old anterolateral infarct, no acute ST-T wave changes. Inferior and lateral T wave changes are unchanged from previous.   07/31/2014   ASSESSMENT AND PLAN   CORONARY ATHEROSCLEROSIS NATIVE CORONARY ARTERY -  The patient has no new sypmtoms.  No further cardiovascular testing is indicated.  We will continue with aggressive risk reduction and meds as listed.  ESSENTIAL HYPERTENSION, BENIGN - His blood pressure is well controlled. He'll continue with  medications as listed.  DYSLIPIDEMIA -  This followed by Emeterio ReeveWOLTERS,SHARON A, MD  VENTRICULAR ECTOPY - The patient has no symptoms related to this. No further evaluation is indicated.   PVD - He has severe disease in the right distal SFA.  However, his symptoms are treatable with walking.  No change in therapy is indicated.

## 2014-07-31 NOTE — Patient Instructions (Signed)
Your physician recommends that you schedule a follow-up appointment in: one year with Dr. Hochrein  

## 2014-08-01 ENCOUNTER — Encounter: Payer: Self-pay | Admitting: Cardiology

## 2014-08-13 ENCOUNTER — Other Ambulatory Visit: Payer: Self-pay | Admitting: *Deleted

## 2014-08-13 ENCOUNTER — Other Ambulatory Visit: Payer: Self-pay | Admitting: Cardiology

## 2014-08-13 MED ORDER — SIMVASTATIN 40 MG PO TABS: 40.0000 mg | ORAL_TABLET | Freq: Every day | ORAL | Status: DC

## 2014-08-18 ENCOUNTER — Other Ambulatory Visit: Payer: Self-pay | Admitting: Neurology

## 2014-08-20 ENCOUNTER — Encounter: Payer: Self-pay | Admitting: Nurse Practitioner

## 2014-08-20 ENCOUNTER — Ambulatory Visit (INDEPENDENT_AMBULATORY_CARE_PROVIDER_SITE_OTHER): Payer: Medicare Other | Admitting: Nurse Practitioner

## 2014-08-20 VITALS — BP 131/67 | HR 77 | Ht 70.0 in | Wt 184.4 lb

## 2014-08-20 DIAGNOSIS — R413 Other amnesia: Secondary | ICD-10-CM | POA: Diagnosis not present

## 2014-08-20 DIAGNOSIS — R259 Unspecified abnormal involuntary movements: Secondary | ICD-10-CM | POA: Diagnosis not present

## 2014-08-20 NOTE — Progress Notes (Signed)
[facility]  PATIENT: Alexander Novak DOB: [birth year]   REASON FOR VISIT: Follow-up for Parkinson's features and memory loss  HISTORY FROM: Patient    HISTORY OF PRESENT ILLNESSMr. Novak is an 81 year old male with a history of memory loss and parkinsonism features. He returns today for follow-up. He is currently taking Aricept and Sinemet and reports that he is tolerating it well. He states that his memory has remained the same. He states that it depends on the situation. He is alone at today's visit Denies having to give up anything due to his memory. They downsized to a townhouse and so therefore they no longer have to do yard work. He does have a good appetite. He does like sweets. He states that he exercises daily. He is able to operate a motor vehicle without difficulty. He has not gotten lost when driving Denies any changes with his gait or balance. He has a tremor in hands left > right. He States that he sleeps well at night. He has no new complaints  HSITORY 07/06/13 (CM): 81 year old male returns for followup. He was last seen in this office by Dr. Terrace Novak 12/06/12. 2014. He has a history of memory loss and mild parkinsonism features. He has past medical history of coronary artery disease, hypertension, hyperlipidemia, aortic aneurysm repair, presenting with gradual onset of left hands tremor, and some short-term memory trouble. He tolerated the Azilect well. Switched to Aricept for his mild memory loss. The [place] was too high in cost. He is still quite active, taking care of his lawn. no gait difficulty. No recent falls. Patient denies REM sleep disorder, he denied loss of smell, denied constipation, orthostatic hypotension. MRI of brain has demonstrated atrophy and small vessel disease    REVIEW OF SYSTEMS: Full 14 system review of systems performed and notable only for those listed, all others are neg:  Constitutional: neg  Cardiovascular:  neg Ear/Nose/Throat: neg  Skin: neg Eyes: neg Respiratory: neg Gastroitestinal: neg  Hematology/Lymphatic: neg  Endocrine: neg Musculoskeletal:neg Allergy/Immunology: neg Neurological: neg Psychiatric: neg Sleep : neg   ALLERGIES: No Known Allergies  HOME MEDICATIONS: Outpatient Prescriptions Prior to Visit  Medication Sig Dispense Refill  . Ascorbic Acid (VITAMIN C) 1000 MG tablet Take 2,000 mg by mouth 2 (two) times daily.     Marland Kitchen aspirin EC 81 MG tablet Take 81 mg by mouth daily.    . AZILECT 1 MG TABS tablet TAKE 1 TABLET BY MOUTH EVERY DAY 90 tablet 0  . B Complex-Biotin-FA (B COMPLETE) TABS Take 1 tablet by mouth daily.    . Coenzyme Q10 (COQ-10 PO) Take 100 mg by mouth daily.    Marland Kitchen donepezil (ARICEPT) 10 MG tablet Take 1 tablet (10 mg total) by mouth daily. (Patient taking differently: Take 20 mg by mouth daily. ) 90 tablet 1  . fish oil-omega-3 fatty acids 1000 MG capsule Take 2 g by mouth daily.    . Ginkgo 60 MG TABS Take 120 mg by mouth 2 (two) times daily.     . LevOCARNitine (CARNITINE PO) Take 500 mg by mouth daily.    . NON FORMULARY Take by mouth daily. procera avh    . simvastatin (ZOCOR) 40 MG tablet Take 1 tablet (40 mg total) by mouth daily. 30 tablet 6  . vitamin E 400 UNIT capsule Take 400 Units by mouth daily.    . carbidopa-levodopa (SINEMET IR) 25-100 MG per tablet TAKE 1 TABLET BY MOUTH 3 TIMES DAILY (Patient  not taking: Reported on 08/20/2014) 90 tablet 10  . AZILECT 1 MG TABS tablet TAKE 1 TABLET BY MOUTH EVERY DAY 90 tablet 1  . donepezil (ARICEPT) 10 MG tablet TAKE 1 TABLET (10 MG TOTAL) BY MOUTH DAILY. 90 tablet 0  . fluocinonide cream (LIDEX) 0.05 %   1  . loratadine (CLARITIN) 10 MG tablet Take 10 mg by mouth daily.    . Simethicone (GAS-X PO) Take by mouth daily.    Marland Kitchen Specialty Vitamins Products (MAGNESIUM, AMINO ACID CHELATE,) 133 MG tablet Take 1 tablet by mouth 2 (two) times daily.     No facility-administered medications prior to visit.     PAST MEDICAL HISTORY: Past Medical History  Diagnosis Date  . Abdominal aortic aneurysm     repaired by Dr. Madilyn Fireman  . CAD (coronary artery disease)     Acute inferior infarct in 1992. PTCA of her current symptoms in 1997 with a distal occluded AV circumflex. He had a proximal 95% stenosis that was treated with PCI)  . HTN (hypertension)   . Hyperlipidemia   . PVD (peripheral vascular disease)     Bilateral femoral artery occlusion managed medically  . Parkinson disease   . Dementia     PAST SURGICAL HISTORY: Past Surgical History  Procedure Laterality Date  . Abdominal aortic aneurysm repair      FAMILY HISTORY: Family History  Problem Relation Age of Onset  . Heart disease    . Stroke Father   . Pneumonia Mother     SOCIAL HISTORY: History   Social History  . Marital Status: Married    Spouse Name: Sallye Ober  . Number of Children: 4  . Years of Education: college   Occupational History  .      Retired   Social History Main Topics  . Smoking status: Former Smoker    Quit date: 04/02/1979  . Smokeless tobacco: Never Used  . Alcohol Use: No  . Drug Use: No  . Sexual Activity: Not on file   Other Topics Concern  . Not on file   Social History Narrative   Patient is retired Art gallery manager / Arts administrator. Patient has a college education. Patient is married to Choudrant    Caffeine- None    Right handed.           PHYSICAL EXAM  Filed Vitals:   08/20/14 1407  BP: 131/67  Pulse: 77  Height: 5\' 10"  (1.778 m)  Weight: 184 lb 6.4 oz (83.643 kg)   Body mass index is 26.46 kg/(m^2). Generalized: Well developed, in no acute distress   Neurological examination  Mentation: Alert oriented to time, place, history taking. Follows all commands speech and language fluent. MMSE 25/30. AFT 11. Clock drawing 3/4.  Cranial nerve II-XII: Pupils were equal round reactive to light. Extraocular movements were full, visual field were full on confrontational test. Facial  sensation and strength were normal. Uvula tongue midline. Head turning and shoulder shrug were normal and symmetric. Motor: The motor testing reveals 5 over 5 strength of all 4 extremities. Good symmetric motor tone is noted throughout. Resting tremor in left hand. Sensory: Sensory testing is intact to soft touch on all 4 extremities. No evidence of extinction is noted.  Coordination: Cerebellar testing reveals good finger-nose-finger and heel-to-shin bilaterally.  Gait and station: patient is able to stand from a sitting position without assistance. Gait is normal. Tandem gait is normal. Romberg is negative. No drift is seen.  Reflexes: Deep tendon reflexes are symmetric  and normal bilaterally.     DIAGNOSTIC DATA (LABS, IMAGING, TESTING) -  ASSESSMENT AND PLAN  80 y.o. year old male  has a past medical history of Abdominal aortic aneurysm; CAD (coronary artery disease); HTN (hypertension); Hyperlipidemia; PVD (peripheral vascular disease); Parkinson disease; and Dementia. his seizures here to follow-up for  1. Memory loss 2. Tremor Continue Sinemet at current dose does not need refills Continue Azilect daily Continue Aricept 10 mg daily will refill Next visit with Dr. Terrace Novak in 6 months Nilda Riggs, [facility], [facility], APRN  [facility] [address] [place], [place] [address] [phone]

## 2014-08-20 NOTE — Patient Instructions (Signed)
Continue Sinemet at current dose does not need refills Continue Aricept 10 mg daily will refill Next visit with Dr. Terrace Arabia in 6 months

## 2014-08-28 NOTE — Progress Notes (Signed)
I have reviewed and agreed above plan. 

## 2014-11-23 ENCOUNTER — Other Ambulatory Visit: Payer: Self-pay | Admitting: Neurology

## 2015-02-02 ENCOUNTER — Other Ambulatory Visit: Payer: Self-pay | Admitting: Neurology

## 2015-02-26 ENCOUNTER — Encounter: Payer: Self-pay | Admitting: Neurology

## 2015-02-26 ENCOUNTER — Ambulatory Visit (INDEPENDENT_AMBULATORY_CARE_PROVIDER_SITE_OTHER): Payer: Medicare Other | Admitting: Neurology

## 2015-02-26 ENCOUNTER — Ambulatory Visit: Payer: Medicare Other | Admitting: Neurology

## 2015-02-26 VITALS — BP 134/76 | HR 80 | Ht 70.0 in | Wt 183.5 lb

## 2015-02-26 DIAGNOSIS — R259 Unspecified abnormal involuntary movements: Secondary | ICD-10-CM | POA: Diagnosis not present

## 2015-02-26 DIAGNOSIS — I2510 Atherosclerotic heart disease of native coronary artery without angina pectoris: Secondary | ICD-10-CM | POA: Diagnosis not present

## 2015-02-26 DIAGNOSIS — I10 Essential (primary) hypertension: Secondary | ICD-10-CM

## 2015-02-26 DIAGNOSIS — R413 Other amnesia: Secondary | ICD-10-CM

## 2015-02-26 MED ORDER — RASAGILINE MESYLATE 1 MG PO TABS: 1.0000 mg | ORAL_TABLET | Freq: Every day | ORAL | Status: DC

## 2015-02-26 MED ORDER — CARBIDOPA-LEVODOPA 25-100 MG PO TABS: 1.0000 | ORAL_TABLET | Freq: Three times a day (TID) | ORAL | Status: DC

## 2015-02-26 MED ORDER — DONEPEZIL HCL 10 MG PO TABS: 10.0000 mg | ORAL_TABLET | Freq: Every day | ORAL | Status: DC

## 2015-02-26 NOTE — Progress Notes (Signed)
[facility]  PATIENT: Alexander Novak DOB: 11/14/1925   REASON FOR VISIT: Follow-up for Parkinson's features and memory loss  HISTORY FROM: Patient    HISTORY OF PRESENT ILLNESSMr. Novak is an 81 year old male with a history of memory loss and parkinsonism features. He returns today for follow-up. He is currently taking Aricept and Sinemet and reports that he is tolerating it well. He states that his memory has remained the same. He states that it depends on the situation. He is alone at today's visit Denies having to give up anything due to his memory. They downsized to a townhouse and so therefore they no longer have to do yard work. He does have a good appetite. He does like sweets. He states that he exercises daily. He is able to operate a motor vehicle without difficulty. He has not gotten lost when driving Denies any changes with his gait or balance. He has a tremor in hands left > right. He States that he sleeps well at night. He has no new complaints  HSITORY 07/06/13 (CM): 81 year old male returns for followup. He was last seen in this office by Dr. Terrace Novak 12/06/12. 2014. He has a history of memory loss and mild parkinsonism features. He has past medical history of coronary artery disease, hypertension, hyperlipidemia, aortic aneurysm repair, presenting with gradual onset of left hands tremor, and some short-term memory trouble. He tolerated the Azilect well. Switched to Aricept for his mild memory loss. The [place] was too high in cost. He is still quite active, taking care of his lawn. no gait difficulty. No recent falls. Patient denies REM sleep disorder, he denied loss of smell, denied constipation, orthostatic hypotension. MRI of brain has demonstrated atrophy and small vessel disease  UPDATE Feb 26 2015: He drove himself to clinic today, he lives with his wife, he had busy Christmas, he has 4 children. He continues to have bilateral hands tremor. He sleeps  well, eating well, no significant gait difficulty,  He is able to tolerate current medications, Aricept 10 mg daily, Azilect 1 mg daily, Sinemet 25/100 mg 3 times a day   REVIEW OF SYSTEMS: Full 14 system review of systems performed and notable only for those listed, all others are neg:  As above  ALLERGIES: No Known Allergies  HOME MEDICATIONS: Outpatient Prescriptions Prior to Visit  Medication Sig Dispense Refill  . Ascorbic Acid (VITAMIN C) 1000 MG tablet Take 2,000 mg by mouth 2 (two) times daily.     Marland Kitchen aspirin EC 81 MG tablet Take 81 mg by mouth daily.    . AZILECT 1 MG TABS tablet TAKE 1 TABLET BY MOUTH EVERY DAY 90 tablet 0  . B Complex-Biotin-FA (B COMPLETE) TABS Take 1 tablet by mouth daily.    . carbidopa-levodopa (SINEMET IR) 25-100 MG per tablet TAKE 1 TABLET 3 TIMES A DAY 90 tablet 3  . Coenzyme Q10 (COQ-10 PO) Take 100 mg by mouth daily.    Marland Kitchen donepezil (ARICEPT) 10 MG tablet Take 1 tablet (10 mg total) by mouth daily. (Patient taking differently: Take 20 mg by mouth daily. ) 90 tablet 1  . fish oil-omega-3 fatty acids 1000 MG capsule Take 2 g by mouth daily.    . Ginkgo 60 MG TABS Take 120 mg by mouth 2 (two) times daily.     . LevOCARNitine (CARNITINE PO) Take 500 mg by mouth daily.    . NON FORMULARY Take by mouth daily. procera avh    .  simvastatin (ZOCOR) 40 MG tablet Take 1 tablet (40 mg total) by mouth daily. 30 tablet 6  . UNABLE TO FIND Med Name:magnesium  po qid    . UNABLE TO FIND Med Name:  po daily    . UNABLE TO FIND Med Name: Zinc  po daily    . vitamin E 400 UNIT capsule Take 400 Units by mouth daily.     No facility-administered medications prior to visit.    PAST MEDICAL HISTORY: Past Medical History  Diagnosis Date  . Abdominal aortic aneurysm (HCC)     repaired by Dr. Madilyn Fireman  . CAD (coronary artery disease)     Acute inferior infarct in 1992. PTCA of her current symptoms in 1997 with a distal occluded AV circumflex. He had a  proximal 95% stenosis that was treated with PCI)  . HTN (hypertension)   . Hyperlipidemia   . PVD (peripheral vascular disease) (HCC)     Bilateral femoral artery occlusion managed medically  . Parkinson disease (HCC)   . Dementia     PAST SURGICAL HISTORY: Past Surgical History  Procedure Laterality Date  . Abdominal aortic aneurysm repair      FAMILY HISTORY: Family History  Problem Relation Age of Onset  . Heart disease    . Stroke Father   . Pneumonia Mother     SOCIAL HISTORY: Social History   Social History  . Marital Status: Married    Spouse Name: Alexander Novak  . Number of Children: 4  . Years of Education: college   Occupational History  .      Retired   Social History Main Topics  . Smoking status: Former Smoker    Quit date: 04/02/1979  . Smokeless tobacco: Never Used  . Alcohol Use: No  . Drug Use: No  . Sexual Activity: Not on file   Other Topics Concern  . Not on file   Social History Narrative   Patient is retired Art gallery manager / Arts administrator. Patient has a college education. Patient is married to Alexander Novak    Caffeine- None    Right handed.           PHYSICAL EXAM  Filed Vitals:   02/26/15 1355  BP: 134/76  Pulse: 80  Height:  (1.778 m)  Weight: 183 lb 8 oz (83.235 kg)   Body mass index is 26.33 kg/(m^2).   PHYSICAL EXAMNIATION:  Gen: NAD, conversant, well nourised, obese, well groomed                     Cardiovascular: Regular rate rhythm, no peripheral edema, warm, nontender. Eyes: Conjunctivae clear without exudates or hemorrhage Neck: Supple, no carotid bruise. Pulmonary: Clear to auscultation bilaterally   NEUROLOGICAL EXAM:  MENTAL STATUS: Speech:    Speech is normal; fluent and spontaneous with normal comprehension.  Cognition: MMSE 29/30, animal naming 8     Orientation to time, place and person     Recent and remote memory: he missed 1/3 recalls     Normal Attention span and concentration     Normal Language, naming,  repeating,spontaneous speech     Fund of knowledge   CRANIAL NERVES: CN II: Visual fields are full to confrontation. Pupils are round equal and briskly reactive to light. CN III, IV, VI: extraocular movement are normal. No ptosis. CN V: Facial sensation is intact to pinprick in all 3 divisions bilaterally. Corneal responses are intact.  CN VII: Face is symmetric with normal eye closure and smile.  CN VIII: Hearing is normal to rubbing fingers CN IX, X: Palate elevates symmetrically. Phonation is normal. CN XI: Head turning and shoulder shrug are intact CN XII: Tongue is midline with normal movements and no atrophy.  MOTOR: He has left hand resting and postural tremor, mild rigidity with rapid wrist opening and closure,  REFLEXES: Reflexes are 2+ and symmetric at the biceps, triceps, knees, and ankles. Plantar responses are flexor.  SENSORY: Intact to light touch, pinprick, position sense, and vibration sense are intact in fingers and toes.  COORDINATION: Rapid alternating movements and fine finger movements are intact. There is no dysmetria on finger-to-nose and heel-knee-shin.    GAIT/STANCE: Posture is normal. Gait is steady with normal steps, base, arm swing, and turning. Heel and toe walking are normal. Tandem gait is normal.  Romberg is absent.   DIAGNOSTIC DATA (LABS, IMAGING, TESTING) -  ASSESSMENT AND PLAN  81 y.o. year old male  has a past medical history of abdominal aortic aneurysm; CAD (coronary artery disease); HTN (hypertension); Hyperlipidemia; PVD (peripheral vascular disease  Mild cognitive impairment  Today's Mini-Mental Status Examination is 29 out of 30  Continue Aricept 10 mg daily, Azilect 1 mg daily, prescription was refilled  Mild parkinsonian features, with left hand tremor  Continue Sinemet  25/100 mg 3 times a day  Levert FeinsteinYijun Henrique Parekh, M.D. Ph.D.  [facility] [address] [place], [place] [address] Phone: [phone] Fax:       [phone]

## 2015-03-03 ENCOUNTER — Other Ambulatory Visit: Payer: Self-pay | Admitting: Cardiology

## 2015-03-05 NOTE — Telephone Encounter (Signed)
Rx request sent to pharmacy.  

## 2015-08-04 NOTE — Progress Notes (Signed)
HPI The patient presents for followup of CAD. Since last saw him he has had no new cardiovascular complaints. He is still exercising with weights.  With this level of activity he denies any chest pressure, neck or arm discomfort. He has no shortness of breath, PND or orthopnea. He denies any palpitations, presyncope or syncope. He has no weight gain or edema. He does not feel his PVCs.    No Known Allergies  Current Outpatient Prescriptions  Medication Sig Dispense Refill  . Ascorbic Acid (VITAMIN C) 1000 MG tablet Take 2,000 mg by mouth 2 (two) times daily.     Marland Kitchen aspirin EC 81 MG tablet Take 81 mg by mouth daily.    . B Complex-Biotin-FA (B COMPLETE) TABS Take 1 tablet by mouth daily.    . carbidopa-levodopa (SINEMET IR) 25-100 MG tablet Take 1 tablet by mouth 3 (three) times daily. 90 tablet 11  . Coenzyme Q10 (COQ-10 PO) Take 100 mg by mouth daily.    Marland Kitchen donepezil (ARICEPT) 10 MG tablet Take 1 tablet (10 mg total) by mouth daily. 90 tablet 3  . fish oil-omega-3 fatty acids 1000 MG capsule Take 2 g by mouth daily.    . Ginkgo 60 MG TABS Take 120 mg by mouth 2 (two) times daily.     . LevOCARNitine (CARNITINE PO) Take 500 mg by mouth daily.    . NON FORMULARY Take by mouth daily. procera avh    . rasagiline (AZILECT) 1 MG TABS tablet Take 1 tablet (1 mg total) by mouth daily. 90 tablet 3  . simvastatin (ZOCOR) 40 MG tablet TAKE 1 TABLET (40 MG TOTAL) BY MOUTH DAILY. 30 tablet 11  . UNABLE TO FIND Med Name:magnesium  po qid    . UNABLE TO FIND Med Name:  po daily    . UNABLE TO FIND Med Name: Zinc  po daily    . UNABLE TO FIND Med Name: Lysime    . UNABLE TO FIND Med Name: Immune Health Support    . vitamin E 400 UNIT capsule Take 400 Units by mouth daily.     No current facility-administered medications for this visit.    Past Medical History  Diagnosis Date  . Abdominal aortic aneurysm (HCC)     repaired by Dr. Madilyn Fireman  . CAD (coronary artery disease)     Acute  inferior infarct in 1992. PTCA of her current symptoms in 1997 with a distal occluded AV circumflex. He had a proximal 95% stenosis that was treated with PCI)  . HTN (hypertension)   . Hyperlipidemia   . PVD (peripheral vascular disease) (HCC)     Bilateral femoral artery occlusion managed medically  . Parkinson disease (HCC)   . Dementia     Past Surgical History  Procedure Laterality Date  . Abdominal aortic aneurysm repair      ROS:  As stated in the HPI and negative for all other systems.  PHYSICAL EXAM BP 155/77 mmHg  Pulse 57  Ht  (1.778 m)  Wt 185 lb (83.915 kg)  BMI 26.54 kg/m2  SpO2 97% GENERAL:  Well appearing HEENT:  Pupils equal round and reactive, fundi not visualized, oral mucosa unremarkable, dentures NECK:  No jugular venous distention, waveform within normal limits, carotid upstroke brisk and symmetric, soft right bruit, no thyromegaly LUNGS:  Clear to auscultation bilaterally HEART:  PMI not displaced or sustained,S1 and S2 within normal limits, no S3, no S4, no clicks, no rubs, no murmurs ABD:  Flat, positive bowel sounds normal in frequency in pitch, no bruits, no rebound, no guarding, no midline pulsatile mass, no hepatomegaly, no splenomegaly, abdominal hernia related to scar EXT:  2 plus pulses upper and diminished lower, trace lower extremity edema, no cyanosis no clubbing NEURO:  Cranial nerves II through XII grossly intact, motor grossly intact throughout, resting pill rolling tremor  EKG:  Sinus rhythm, rate 57  rightward axis, old inferior infarct, old anterolateral infarct, no acute ST-T wave changes. Inferior and lateral T wave changes are unchanged from previous.   08/05/2015   ASSESSMENT AND PLAN   CORONARY ATHEROSCLEROSIS NATIVE CORONARY ARTERY -  The patient has no new sypmtoms.  No further cardiovascular testing is indicated.  We will continue with aggressive risk reduction and meds as listed.  ESSENTIAL HYPERTENSION, BENIGN - His blood  pressure is well controlled. He'll continue with medications as listed.  DYSLIPIDEMIA -  This followed by Emeterio ReeveWOLTERS,SHARON A, MD  VENTRICULAR ECTOPY - The patient has no symptoms related to this. No further evaluation is indicated.   PVD - He has severe disease in the right distal SFA.  However, his symptoms are treatable with walking.  No change in therapy is indicated.   BRUIT - I will check a carotid Doppler.

## 2015-08-05 ENCOUNTER — Encounter: Payer: Self-pay | Admitting: Cardiology

## 2015-08-05 ENCOUNTER — Ambulatory Visit (INDEPENDENT_AMBULATORY_CARE_PROVIDER_SITE_OTHER): Payer: Medicare Other | Admitting: Cardiology

## 2015-08-05 VITALS — BP 155/77 | HR 57 | Ht 70.0 in | Wt 185.0 lb

## 2015-08-05 DIAGNOSIS — I2510 Atherosclerotic heart disease of native coronary artery without angina pectoris: Secondary | ICD-10-CM | POA: Diagnosis not present

## 2015-08-05 DIAGNOSIS — I10 Essential (primary) hypertension: Secondary | ICD-10-CM

## 2015-08-05 NOTE — Patient Instructions (Signed)
Your physician wants you to follow-up in: 1 Year. You will receive a reminder letter in the mail two months in advance. If you don't receive a letter, please call our office to schedule the follow-up appointment.  

## 2016-01-27 ENCOUNTER — Other Ambulatory Visit: Payer: Self-pay | Admitting: *Deleted

## 2016-01-27 MED ORDER — RASAGILINE MESYLATE 1 MG PO TABS: 1.0000 mg | ORAL_TABLET | Freq: Every day | ORAL | 0 refills | Status: DC

## 2016-02-12 ENCOUNTER — Other Ambulatory Visit: Payer: Self-pay | Admitting: Neurology

## 2016-02-12 ENCOUNTER — Other Ambulatory Visit: Payer: Self-pay | Admitting: Cardiology

## 2016-02-26 ENCOUNTER — Encounter: Payer: Self-pay | Admitting: Nurse Practitioner

## 2016-02-26 ENCOUNTER — Telehealth: Payer: Self-pay | Admitting: Nurse Practitioner

## 2016-02-26 ENCOUNTER — Ambulatory Visit (INDEPENDENT_AMBULATORY_CARE_PROVIDER_SITE_OTHER): Payer: Medicare Other | Admitting: Nurse Practitioner

## 2016-02-26 VITALS — BP 134/75 | HR 88 | Ht 70.0 in | Wt 189.4 lb

## 2016-02-26 DIAGNOSIS — I10 Essential (primary) hypertension: Secondary | ICD-10-CM

## 2016-02-26 DIAGNOSIS — R413 Other amnesia: Secondary | ICD-10-CM

## 2016-02-26 DIAGNOSIS — G20 Parkinson's disease: Secondary | ICD-10-CM | POA: Diagnosis not present

## 2016-02-26 DIAGNOSIS — R259 Unspecified abnormal involuntary movements: Secondary | ICD-10-CM | POA: Diagnosis not present

## 2016-02-26 MED ORDER — CARBIDOPA-LEVODOPA 25-100 MG PO TABS: 1.0000 | ORAL_TABLET | Freq: Three times a day (TID) | ORAL | 3 refills | Status: DC

## 2016-02-26 MED ORDER — DONEPEZIL HCL 10 MG PO TABS: 10.0000 mg | ORAL_TABLET | Freq: Every day | ORAL | 3 refills | Status: DC

## 2016-02-26 MED ORDER — RASAGILINE MESYLATE 1 MG PO TABS: 1.0000 mg | ORAL_TABLET | Freq: Every day | ORAL | 3 refills | Status: DC

## 2016-02-26 MED ORDER — MEMANTINE HCL 28 X 5 MG & 21 X 10 MG PO TABS
ORAL_TABLET | ORAL | 0 refills | Status: DC
Start: 2016-02-26 — End: 2016-08-13

## 2016-02-26 NOTE — Telephone Encounter (Signed)
Pt's wife called said [facility](NAMENDA TITRATION PACK) tablet pack and was told they have not rec'd an order for it.
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
## 2016-02-26 NOTE — Patient Instructions (Addendum)
Add Namenda for memory titration pack call in one month for RX Continue Aricept for memory Continue Sinemet 25/  100 3 times daily 30 minutes before meals Continue Azilect 1 mg daily Follow-up in 6 months

## 2016-02-26 NOTE — Telephone Encounter (Signed)
Received fax confirmation [facility] [phone].  Relayed to wife.

## 2016-03-05 NOTE — Progress Notes (Signed)
I have reviewed and agreed above plan. 

## 2016-03-06 NOTE — Progress Notes (Signed)
Faxed note to Dr. Paulino RilyWolters. (confirmed).

## 2016-03-09 ENCOUNTER — Telehealth: Payer: Self-pay | Admitting: Nurse Practitioner

## 2016-03-09 NOTE — Telephone Encounter (Signed)
Spoke to pts wife on her mobile.  She is relayed that pt tried to get namenda titration pack and was not able to get this, then stated he would not take it anyway.  She expressed that he is stubborn and will not stop his [place] (his routine for 4 yrs).  I told her to continue to call DMV, or go see in person and speak to them.  I also told her to see about getting information on line [employer]/org for support groups for caregivers.  (for information on what to do).  Have a family meeting with children (4).  I have seniorline resource guide I can mail to her.  I would like CM/NP know and if other reccs to call her back.  Will call the pharmacy and see what was said there about namenda. Namenda titration pack not covered.  Did you want to do memantine 5mg  tabs and titrate that way?
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
## 2016-03-09 NOTE — Telephone Encounter (Signed)
LMVM for Alexander Novak to call me back.

## 2016-03-10 NOTE — Telephone Encounter (Signed)
Yes did fax note to pcp.

## 2016-03-11 ENCOUNTER — Other Ambulatory Visit: Payer: Self-pay | Admitting: Neurology

## 2016-03-30 ENCOUNTER — Telehealth: Payer: Self-pay | Admitting: *Deleted

## 2016-03-30 NOTE — Telephone Encounter (Signed)
Labs received from PCP [facility] (collected on 03/26/16):  CBC w/out DIFF: WBC - 11.7 H All other values wnl.  CMP: eGFR - 60 L All other values wnl.  PSA - .55 wnl  TSH - 4.05 wnl  LIPID PANEL - wnl Cholesterol - 173 - wnl

## 2016-07-17 ENCOUNTER — Other Ambulatory Visit: Payer: Self-pay

## 2016-07-17 MED ORDER — SIMVASTATIN 40 MG PO TABS: ORAL_TABLET | ORAL | 0 refills | Status: DC

## 2016-07-24 ENCOUNTER — Other Ambulatory Visit: Payer: Self-pay

## 2016-07-24 MED ORDER — SIMVASTATIN 40 MG PO TABS: ORAL_TABLET | ORAL | 0 refills | Status: DC

## 2016-07-24 MED ORDER — SIMVASTATIN 40 MG PO TABS: 40.0000 mg | ORAL_TABLET | Freq: Every day | ORAL | 0 refills | Status: DC

## 2016-08-10 NOTE — Progress Notes (Signed)
HPI The patient presents for followup of CAD. Since last saw him he has done well from a cardiac standpoint.  The patient denies any new symptoms such as chest discomfort, neck or arm discomfort. There has been no new shortness of breath, PND or orthopnea. There have been no reported palpitations, presyncope or syncope.  His wife reports that his dementia and Parkinsons are worse.  He still does weight exercises and does well with this.     No Known Allergies  Current Outpatient Prescriptions  Medication Sig Dispense Refill  . Ascorbic Acid (VITAMIN C) 1000 MG tablet Take 1,000 mg by mouth 2 (two) times daily.     Marland Kitchen. aspirin EC 81 MG tablet Take 81 mg by mouth daily.    . B Complex-Biotin-FA (B COMPLETE) TABS Take 1 tablet by mouth daily.    . carbidopa-levodopa (SINEMET IR) 25-100 MG tablet Take 1 tablet by mouth 3 (three) times daily. 270 tablet 3  . Coenzyme Q10 (COQ-10 PO) Take 100 mg by mouth daily.    Marland Kitchen. donepezil (ARICEPT) 10 MG tablet Take 1 tablet (10 mg total) by mouth daily. 90 tablet 3  . fexofenadine (ALLEGRA) 180 MG tablet Take 180 mg by mouth daily as needed for allergies or rhinitis.    . fish oil-omega-3 fatty acids 1000 MG capsule Take 2 g by mouth daily.    . Ginkgo 60 MG TABS Take 120 mg by mouth 2 (two) times daily.     . LevOCARNitine (CARNITINE PO) Take 500 mg by mouth daily.    . NON FORMULARY Take by mouth daily. procera avh    . rasagiline (AZILECT) 1 MG TABS tablet Take 1 tablet (1 mg total) by mouth daily. 90 tablet 3  . simethicone (MYLICON) 125 MG chewable tablet Chew 125 mg by mouth every 6 (six) hours as needed for flatulence.    . simvastatin (ZOCOR) 40 MG tablet Take 1 tablet (40 mg total) by mouth at bedtime. 30 tablet 0  . UNABLE TO FIND Take 250 mg by mouth daily. Med Name:magnesium 500mg  po qid     . UNABLE TO FIND Place 1,000 mg into both eyes 2 (two) times daily. Med Name: Lysine    . UNABLE TO FIND Take 1 capsule by mouth daily. Med Name: Immune  Health Support     . vitamin E 400 UNIT capsule Take 400 Units by mouth daily.     No current facility-administered medications for this visit.     Past Medical History:  Diagnosis Date  . Abdominal aortic aneurysm (HCC)    repaired by Dr. Madilyn FiremanHayes  . CAD (coronary artery disease)    Acute inferior infarct in 1992. PTCA of her current symptoms in 1997 with a distal occluded AV circumflex. He had a proximal 95% stenosis that was treated with PCI)  . Dementia   . HTN (hypertension)   . Hyperlipidemia   . Parkinson disease (HCC)   . PVD (peripheral vascular disease) (HCC)    Bilateral femoral artery occlusion managed medically    Past Surgical History:  Procedure Laterality Date  . ABDOMINAL AORTIC ANEURYSM REPAIR      ROS:  Positive for constipation and insomnia.  Otherwise as stated in the HPI and negative for all other systems.  PHYSICAL EXAM BP 128/68   Pulse 63   Ht 5\' 10"  (1.778 m)   Wt 179 lb (81.2 kg)   BMI 25.68 kg/m   GENERAL:  Well appearing NECK:  No jugular venous distention, waveform within normal limits, carotid upstroke brisk and symmetric, no bruits, no thyromegaly LUNGS:  Clear to auscultation bilaterally CHEST:  Unremarkable HEART:  PMI not displaced or sustained,S1 and S2 within normal limits, no S3, no S4, no clicks, no rubs, no murmurs ABD:  Flat, positive bowel sounds normal in frequency in pitch, no bruits, no rebound, no guarding, no midline pulsatile mass, no hepatomegaly, no splenomegaly EXT:  2 plus pulses throughout, no edema, no cyanosis no clubbing NERUO:  Resting tremor.     EKG:  Sinus rhythm, rate 63 rightward axis, old inferior infarct, old anterolateral infarct, no acute ST-T wave changes. Inferior and lateral T wave changes are less pronounced than previous. PVC   08/13/2016  Lab Results  Component Value Date   CHOL 129 04/01/2010   TRIG 123.0 04/01/2010   HDL 34.30 (L) 04/01/2010   LDLCALC 70 04/01/2010   ASSESSMENT AND  PLAN   CORONARY ATHEROSCLEROSIS NATIVE CORONARY ARTERY -  The patient has no new sypmtoms.  No further cardiovascular testing is indicated.  We will continue with aggressive risk reduction and meds as listed.  ESSENTIAL HYPERTENSION, BENIGN - The blood pressure is at target. No change in medications is indicated. We will continue with therapeutic lifestyle changes (TLC).  DYSLIPIDEMIA -  This followed by Mila Palmer, MD .  His most recent LDL was 99 with an HDL of 50.   This is a reasonable ratio.  He eats too much sugar in hid diet however.    VENTRICULAR ECTOPY - He has no symptoms with this.  No change in therapy is indicated.   PVD - He has severe disease in the right distal SFA.  He has no symptoms with this.  No change in therapy is indicated.

## 2016-08-13 ENCOUNTER — Encounter: Payer: Self-pay | Admitting: Cardiology

## 2016-08-13 ENCOUNTER — Ambulatory Visit (INDEPENDENT_AMBULATORY_CARE_PROVIDER_SITE_OTHER): Payer: Medicare Other | Admitting: Cardiology

## 2016-08-13 VITALS — BP 128/68 | HR 63 | Ht 70.0 in | Wt 179.0 lb

## 2016-08-13 DIAGNOSIS — I2510 Atherosclerotic heart disease of native coronary artery without angina pectoris: Secondary | ICD-10-CM | POA: Diagnosis not present

## 2016-08-13 DIAGNOSIS — E785 Hyperlipidemia, unspecified: Secondary | ICD-10-CM

## 2016-08-13 DIAGNOSIS — I10 Essential (primary) hypertension: Secondary | ICD-10-CM

## 2016-08-13 NOTE — Patient Instructions (Signed)

## 2016-08-22 ENCOUNTER — Other Ambulatory Visit: Payer: Self-pay | Admitting: Cardiology

## 2016-08-26 ENCOUNTER — Ambulatory Visit (INDEPENDENT_AMBULATORY_CARE_PROVIDER_SITE_OTHER): Payer: Medicare Other | Admitting: Nurse Practitioner

## 2016-08-26 ENCOUNTER — Encounter: Payer: Self-pay | Admitting: Nurse Practitioner

## 2016-08-26 VITALS — BP 138/78 | HR 61 | Ht 70.0 in | Wt 180.2 lb

## 2016-08-26 DIAGNOSIS — G20 Parkinson's disease: Secondary | ICD-10-CM

## 2016-08-26 DIAGNOSIS — G20A1 Parkinson's disease without dyskinesia, without mention of fluctuations: Secondary | ICD-10-CM

## 2016-08-26 DIAGNOSIS — R413 Other amnesia: Secondary | ICD-10-CM | POA: Diagnosis not present

## 2016-08-26 DIAGNOSIS — R259 Unspecified abnormal involuntary movements: Secondary | ICD-10-CM

## 2016-08-26 NOTE — Patient Instructions (Signed)
Continue Aricept for memory will refill Continue Sinemet 25/  100 3 times daily 30 minutes before meals this is for Parkinson's Continue Azilect 1 mg daily this is for Parkinson's Follow-up in 6 months next with Dr. Terrace ArabiaYan
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
## 2016-08-26 NOTE — Progress Notes (Signed)
I have read the note, and I agree with the clinical assessment and plan.  Macrina Lehnert KEITH   

## 2016-11-10 ENCOUNTER — Telehealth: Payer: Self-pay | Admitting: Nurse Practitioner
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
## 2016-11-16 NOTE — Telephone Encounter (Signed)
Attempted to call wife. Immediately rang busy.

## 2016-11-16 NOTE — Telephone Encounter (Signed)
Received call back for patient's wife. She stated she needs to make appointment to get papers form DMV filled out. Advised her no appt is needed; she needs to bring papers in and when completed, she will get a call to pick them up. She verbalized understanding, appreciation.

## 2016-11-16 NOTE — Telephone Encounter (Signed)
Attempted to reach wife again. Phone was answered but no one spoke.

## 2016-11-16 NOTE — Telephone Encounter (Signed)
Pt's wife called she has rec'd forms from [facility]. She is going to bring the forms to the clinic. It states if the medical report form is not filled out and returned within 30 days pt's license will be cancelled until they rec' the medical report. Please call to discuss before she brings the forms.

## 2016-11-19 ENCOUNTER — Telehealth: Payer: Self-pay | Admitting: *Deleted

## 2016-11-19 NOTE — Telephone Encounter (Addendum)
[place] DMV papers on C Martin NP's desk for review, signature.

## 2016-11-20 NOTE — Telephone Encounter (Signed)
DMV reviewed and signed by CM/NP to MR.

## 2016-11-26 NOTE — Telephone Encounter (Signed)
Spoke with patients wife to advise DMV paperwork has been completed and will be placed up front for pick up.  Aware to pay $50 payment for completed paperwork when picking up.  FYI

## 2016-11-30 DIAGNOSIS — Z0289 Encounter for other administrative examinations: Secondary | ICD-10-CM

## 2017-01-24 ENCOUNTER — Other Ambulatory Visit: Payer: Self-pay | Admitting: Nurse Practitioner

## 2017-03-08 ENCOUNTER — Encounter: Payer: Self-pay | Admitting: Neurology

## 2017-03-08 ENCOUNTER — Ambulatory Visit (INDEPENDENT_AMBULATORY_CARE_PROVIDER_SITE_OTHER): Payer: Medicare Other | Admitting: Neurology

## 2017-03-08 VITALS — BP 132/72 | HR 78 | Ht 70.0 in | Wt 177.0 lb

## 2017-03-08 DIAGNOSIS — R413 Other amnesia: Secondary | ICD-10-CM | POA: Diagnosis not present

## 2017-03-08 DIAGNOSIS — G20 Parkinson's disease: Secondary | ICD-10-CM

## 2017-03-08 MED ORDER — DONEPEZIL HCL 10 MG PO TABS: 10.0000 mg | ORAL_TABLET | Freq: Every day | ORAL | 3 refills | Status: DC

## 2017-03-08 MED ORDER — RASAGILINE MESYLATE 1 MG PO TABS: 1.0000 mg | ORAL_TABLET | Freq: Every day | ORAL | 4 refills | Status: DC
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
## 2017-08-18 NOTE — Progress Notes (Signed)
HPI The patient presents for followup of CAD. Since last saw him he has done well.  The patient denies any new symptoms such as chest discomfort, neck or arm discomfort. There has been no new shortness of breath, PND or orthopnea. There have been no reported palpitations, presyncope or syncope.  He does have dementia.  He does some squats and dumbell exercises daily.   The patient denies any new symptoms such as chest discomfort, neck or arm discomfort. There has been no new shortness of breath, PND or orthopnea. There have been no reported palpitations, presyncope or syncope.    No Known Allergies  Current Outpatient Medications  Medication Sig Dispense Refill  . Ascorbic Acid (VITAMIN C) 1000 MG tablet Take 1,000 mg by mouth 2 (two) times daily.     Marland Kitchen. aspirin EC 81 MG tablet Take 81 mg by mouth daily.    . B Complex-Biotin-FA (B COMPLETE) TABS Take 1 tablet by mouth daily.    . carbidopa-levodopa (SINEMET IR) 25-100 MG tablet TAKE 1 TABLET BY MOUTH 3 (THREE) TIMES DAILY. 270 tablet 3  . Coenzyme Q10 (COQ-10 PO) Take 100 mg by mouth daily.    Marland Kitchen. donepezil (ARICEPT) 10 MG tablet Take 1 tablet (10 mg total) by mouth daily. 90 tablet 3  . fish oil-omega-3 fatty acids 1000 MG capsule Take 2 g by mouth daily.    . Ginkgo 60 MG TABS Take 120 mg by mouth 2 (two) times daily.     . LevOCARNitine (CARNITINE PO) Take 500 mg by mouth daily.    . NON FORMULARY Take by mouth daily. procera avh    . OVER THE COUNTER MEDICATION [facility] sleeping pills.    . rasagiline (AZILECT) 1 MG TABS tablet Take 1 tablet (1 mg total) by mouth daily. 90 tablet 4  . simethicone (MYLICON) 80 MG chewable tablet Chew 80 mg by mouth every 6 (six) hours as needed for flatulence.    . simvastatin (ZOCOR) 40 MG tablet TAKE 1 TABLET BY MOUTH AT BEDTIME 90 tablet 3  . UNABLE TO FIND Take 250 mg by mouth daily. Med Name:magnesium 500mg  po qid     . UNABLE TO FIND Place 1,000 mg into both eyes 2 (two) times daily. Med Name:  Lysine    . UNABLE TO FIND Take 1 capsule by mouth daily. Med Name: Immune Health Support     . vitamin E 400 UNIT capsule Take 400 Units by mouth daily.     No current facility-administered medications for this visit.     Past Medical History:  Diagnosis Date  . Abdominal aortic aneurysm (HCC)    repaired by Dr. Madilyn FiremanHayes  . CAD (coronary artery disease)    Acute inferior infarct in 1992. PTCA of her current symptoms in 1997 with a distal occluded AV circumflex. He had a proximal 95% stenosis that was treated with PCI)  . Dementia   . HTN (hypertension)   . Hyperlipidemia   . Parkinson disease (HCC)   . PVD (peripheral vascular disease) (HCC)    Bilateral femoral artery occlusion managed medically    Past Surgical History:  Procedure Laterality Date  . ABDOMINAL AORTIC ANEURYSM REPAIR      ROS:  As stated in the HPI and negative for all other systems.   PHYSICAL EXAM BP 115/68   Pulse 72   Ht 5\' 10"  (1.778 m)   Wt 180 lb 6.4 oz (81.8 kg)   BMI 25.88 kg/m  GENERAL:  Well appearing NECK:  No jugular venous distention, waveform within normal limits, carotid upstroke brisk and symmetric, RIGHT bruit, no thyromegaly LUNGS:  Clear to auscultation bilaterally CHEST:  Unremarkable HEART:  PMI not displaced or sustained,S1 and S2 within normal limits, no S3, no S4, no clicks, no rubs, soft apical systolic murmur, no diastolic murmurs ABD:  Flat, positive bowel sounds normal in frequency in pitch, no bruits, no rebound, no guarding, no midline pulsatile mass, no hepatomegaly, no splenomegaly EXT:  2 plus pulses throughout, no edema, no cyanosis no clubbing NERUO:  Resting tremor   EKG:  Sinus rhythm, rate 72 rightward axis, old inferior infarct, old anterolateral infarct, no acute ST-T wave changes. Inferior and lateral T wave changes are less pronounced than previous. Poor anterior R wave progression  08/19/2017  ASSESSMENT AND PLAN   CORONARY ATHEROSCLEROSIS NATIVE CORONARY  ARTERY -  The patient has no new sypmtoms.  No further cardiovascular testing is indicated.  We will continue with aggressive risk reduction and meds as listed.  ESSENTIAL HYPERTENSION, BENIGN - The blood pressure is at target. No change in medications is indicated. We will continue with therapeutic lifestyle changes (TLC).  DYSLIPIDEMIA -  LDL is mildly elevated.  However, he really does not want to take more medications.  The LDL was 119.  He is done well with his Zocor.  Given his advanced age, dementia and difficulty with medications per his wife I will not change his current regimen.   Labs reviewed.   VENTRICULAR ECTOPY - He has no symptoms related to this.   PVD - He has severe disease in the right distal SFA.  However, he has not wanted this to be evaluated in the past.  He is not having claudication with exercise.  He is describing some nighttime cramping that I think is unrelated.  At this point I will not pursue further imaging.  He is encouraged to walk.     BRUIT - I will order carotid Doppler

## 2017-08-19 ENCOUNTER — Encounter: Payer: Self-pay | Admitting: Cardiology

## 2017-08-19 ENCOUNTER — Ambulatory Visit (INDEPENDENT_AMBULATORY_CARE_PROVIDER_SITE_OTHER): Payer: Medicare Other | Admitting: Cardiology

## 2017-08-19 ENCOUNTER — Ambulatory Visit (HOSPITAL_COMMUNITY)
Admission: RE | Admit: 2017-08-19 | Discharge: 2017-08-19 | Disposition: A | Discharge status: Home / Self Care | Payer: Medicare Other | Source: Ambulatory Visit | Attending: Cardiology | Admitting: Cardiology

## 2017-08-19 VITALS — BP 115/68 | HR 72 | Ht 70.0 in | Wt 180.4 lb

## 2017-08-19 DIAGNOSIS — I6523 Occlusion and stenosis of bilateral carotid arteries: Secondary | ICD-10-CM | POA: Insufficient documentation

## 2017-08-19 DIAGNOSIS — E785 Hyperlipidemia, unspecified: Secondary | ICD-10-CM

## 2017-08-19 DIAGNOSIS — R0989 Other specified symptoms and signs involving the circulatory and respiratory systems: Secondary | ICD-10-CM

## 2017-08-19 DIAGNOSIS — I10 Essential (primary) hypertension: Secondary | ICD-10-CM | POA: Diagnosis not present

## 2017-08-19 DIAGNOSIS — I2510 Atherosclerotic heart disease of native coronary artery without angina pectoris: Secondary | ICD-10-CM | POA: Diagnosis not present

## 2017-08-19 NOTE — Patient Instructions (Signed)
Medication Instructions:  Continue current medications  If you need a refill on your cardiac medications before your next appointment, please call your pharmacy.  Labwork: None Ordered   Testing/Procedures: Your physician has requested that you have a carotid duplex. This test is an ultrasound of the carotid arteries in your neck. It looks at blood flow through these arteries that supply the brain with blood. Allow one hour for this exam. There are no restrictions or special instructions.  Follow-Up: Your physician wants you to follow-up in: 1 Year. You should receive a reminder letter in the mail two months in advance. If you do not receive a letter, please call our office [phone].    Thank you for choosing [facility]!!      

## 2017-09-20 ENCOUNTER — Other Ambulatory Visit: Payer: Self-pay | Admitting: Cardiology

## 2018-01-22 ENCOUNTER — Other Ambulatory Visit: Payer: Self-pay | Admitting: Neurology
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
## 2018-03-08 ENCOUNTER — Ambulatory Visit (INDEPENDENT_AMBULATORY_CARE_PROVIDER_SITE_OTHER): Payer: Medicare Other | Admitting: Nurse Practitioner

## 2018-03-08 ENCOUNTER — Encounter: Payer: Self-pay | Admitting: Nurse Practitioner

## 2018-03-08 VITALS — BP 123/59 | HR 69 | Ht 70.0 in | Wt 172.0 lb

## 2018-03-08 DIAGNOSIS — F0390 Unspecified dementia without behavioral disturbance: Secondary | ICD-10-CM

## 2018-03-08 DIAGNOSIS — G20 Parkinson's disease: Secondary | ICD-10-CM

## 2018-03-08 MED ORDER — DONEPEZIL HCL 10 MG PO TABS: 10.0000 mg | ORAL_TABLET | Freq: Every day | ORAL | 3 refills | Status: DC

## 2018-03-08 MED ORDER — CARBIDOPA-LEVODOPA 25-100 MG PO TABS: 1.0000 | ORAL_TABLET | Freq: Three times a day (TID) | ORAL | 3 refills | Status: DC

## 2018-03-08 MED ORDER — RASAGILINE MESYLATE 1 MG PO TABS: 1.0000 mg | ORAL_TABLET | Freq: Every day | ORAL | 3 refills | Status: DC

## 2018-03-08 NOTE — Patient Instructions (Signed)
Memory score is stable at 26/30 Continue Aricept 10mg  daily for memory will refill Continue Carbodopa- Levodopa 25/100mg  3 times daily 30 minutes before meals will refill Continue Azilect daily will refill Follow up yearly and prn

## 2018-03-08 NOTE — Progress Notes (Signed)
I have read the note, and I agree with the clinical assessment and plan.  Charles K Willis   

## 2018-04-04 ENCOUNTER — Telehealth: Payer: Self-pay | Admitting: Neurology

## 2018-04-04 NOTE — Telephone Encounter (Signed)
Spoke with both Mr and Mrs. Mehmedovic about how he should take his Carbidopa-Levodopa. Per Carolyn's note from 03-08-2018 patient is to take his carbidopa-levodopa 30 mins prior to his meals 3x a day. They both verbalized understanding. No other questions or concerns at this time.

## 2018-04-04 NOTE — Telephone Encounter (Signed)
Pt's wife/Louise/DPR states husband is refusing to take carbidopa-levodopa (SINEMET IR) 25-100 MG tablet as directed by Carolyn,NP. Pt said he is going to take it like he was prior to last OV as Eber Jones did not say to take it 30 minutes prior to meal 3x day. Is suggestions. Please call after 12, they have an errand to run this morning.

## 2018-08-18 ENCOUNTER — Other Ambulatory Visit: Payer: Self-pay | Admitting: Cardiology

## 2018-10-12 ENCOUNTER — Telehealth: Payer: Self-pay | Admitting: Cardiology

## 2018-10-12 NOTE — Telephone Encounter (Signed)
New Message:   Pt wife called and said she needs to come in with pt for his appt on 10-17-18 with Dr Warren Lacy. Pt have Dementia and Parkinson. She needs to come in with him

## 2018-10-12 NOTE — Telephone Encounter (Signed)
The patient's wife has been called and advised that there will be a prescreening prior to the appointment for both of them and that they will both need to wear a mask. She verbalized her understanding.

## 2018-10-16 NOTE — Progress Notes (Signed)
HPI The patient presents for followup of CAD. Since last saw him he has done well.  The patient denies any new symptoms such as chest discomfort, neck or arm discomfort. There has been no new shortness of breath, PND or orthopnea. There have been no reported palpitations, presyncope or syncope.  He has dementia.    He does get some right calf cramping at night.   No Known Allergies  Current Outpatient Medications  Medication Sig Dispense Refill  . Ascorbic Acid (VITAMIN C) 1000 MG tablet Take 1,000 mg by mouth 2 (two) times daily.     Marland Kitchen. aspirin EC 81 MG tablet Take 81 mg by mouth daily.    . B Complex-Biotin-FA (B COMPLETE) TABS Take 1 tablet by mouth daily.    . carbidopa-levodopa (SINEMET IR) 25-100 MG tablet Take 1 tablet by mouth 3 (three) times daily. 270 tablet 3  . Coenzyme Q10 (COQ-10 PO) Take 100 mg by mouth daily.    Marland Kitchen. donepezil (ARICEPT) 10 MG tablet Take 1 tablet (10 mg total) by mouth daily. 90 tablet 3  . fish oil-omega-3 fatty acids 1000 MG capsule Take 2 g by mouth daily.    . Ginkgo 60 MG TABS Take 120 mg by mouth 2 (two) times daily.     . LevOCARNitine (CARNITINE PO) Take 500 mg by mouth daily.    . NON FORMULARY Take by mouth daily. procera avh    . OVER THE COUNTER MEDICATION [facility] sleeping pills.    . rasagiline (AZILECT) 1 MG TABS tablet Take 1 tablet (1 mg total) by mouth daily. 90 tablet 3  . simethicone (MYLICON) 80 MG chewable tablet Chew 80 mg by mouth every 6 (six) hours as needed for flatulence.    . simvastatin (ZOCOR) 40 MG tablet Take 1 tablet (40 mg total) by mouth daily. KEEP AUGUST APPOINTMENT 90 tablet 0  . UNABLE TO FIND Take 250 mg by mouth daily. Med Name:magnesium 500mg  po qid     . UNABLE TO FIND Place 1,000 mg into both eyes 2 (two) times daily. Med Name: Lysine    . UNABLE TO FIND Take 1 capsule by mouth daily. Med Name: Immune Health Support     . vitamin E 400 UNIT capsule Take 400 Units by mouth daily.     No current  facility-administered medications for this visit.     Past Medical History:  Diagnosis Date  . Abdominal aortic aneurysm (HCC)    repaired by Dr. Madilyn FiremanHayes  . CAD (coronary artery disease)    Acute inferior infarct in 1992. PTCA of her current symptoms in 1997 with a distal occluded AV circumflex. He had a proximal 95% stenosis that was treated with PCI)  . Dementia (HCC)   . HTN (hypertension)   . Hyperlipidemia   . Parkinson disease (HCC)   . PVD (peripheral vascular disease) (HCC)    Bilateral femoral artery occlusion managed medically    Past Surgical History:  Procedure Laterality Date  . ABDOMINAL AORTIC ANEURYSM REPAIR      ROS:  As stated in the HPI and negative for all other systems.  PHYSICAL EXAM BP 134/69   Temp (!) 97.4 F (36.3 C)   Ht 5' 10.5" (1.791 m)   Wt 173 lb (78.5 kg)   SpO2 93%   BMI 24.47 kg/m   GENERAL:  Well appearing NECK:  No jugular venous distention, waveform within normal limits, carotid upstroke brisk and symmetric, poisitvebruits, no thyromegaly LUNGS:  Clear to auscultation bilaterally CHEST:  Unremarkable HEART:  PMI not displaced or sustained,S1 and S2 within normal limits, no S3, no S4, no clicks, no rubs, no murmurs ABD:  Flat, positive bowel sounds normal in frequency in pitch, no bruits, no rebound, no guarding, no midline pulsatile mass, no hepatomegaly, no splenomegaly EXT:  2 plus pulses throughout, no edema, no cyanosis no clubbing NEURO:  Resting tremor, dementia.    EKG:  Sinus rhythm, rate 64  LAD, old inferior infarct, old anterolateral infarct, no acute ST-T wave changes. Inferior and lateral T wave changes are less pronounced than previous. Poor anterior R wave progression. PVCs 10/17/2018  ASSESSMENT AND PLAN   CORONARY ATHEROSCLEROSIS NATIVE CORONARY ARTERY -  The patient has no new sypmtoms.  No further cardiovascular testing is indicated.  We will continue with aggressive risk reduction and meds as listed.  ESSENTIAL  HYPERTENSION, BENIGN - The blood pressure is at target. No change in medications is indicated. We will continue with therapeutic lifestyle changes (TLC).  DYSLIPIDEMIA -  LDL was 68  No change in therapy.   VENTRICULAR ECTOPY - He has no symptoms with these.  No change in therapy.   PVD - He has severe disease in the right distal SFA.    He is not having claudication.  He has wanted conservative therapy.  No further testing.  We talked about good foot care.   BRUIT - He had mild stenosis and no follow up is indicated.

## 2018-10-17 ENCOUNTER — Ambulatory Visit (INDEPENDENT_AMBULATORY_CARE_PROVIDER_SITE_OTHER): Payer: Medicare Other | Admitting: Cardiology

## 2018-10-17 ENCOUNTER — Other Ambulatory Visit: Payer: Self-pay

## 2018-10-17 ENCOUNTER — Encounter: Payer: Self-pay | Admitting: Cardiology

## 2018-10-17 VITALS — BP 134/69 | Temp 97.4°F | Ht 70.5 in | Wt 173.0 lb

## 2018-10-17 DIAGNOSIS — I2510 Atherosclerotic heart disease of native coronary artery without angina pectoris: Secondary | ICD-10-CM | POA: Diagnosis not present

## 2018-10-17 DIAGNOSIS — E785 Hyperlipidemia, unspecified: Secondary | ICD-10-CM

## 2018-10-17 DIAGNOSIS — I493 Ventricular premature depolarization: Secondary | ICD-10-CM

## 2018-10-17 DIAGNOSIS — I10 Essential (primary) hypertension: Secondary | ICD-10-CM

## 2018-10-17 NOTE — Patient Instructions (Signed)

## 2018-11-30 ENCOUNTER — Other Ambulatory Visit: Payer: Self-pay | Admitting: Cardiology

## 2019-02-20 ENCOUNTER — Telehealth: Payer: Self-pay | Admitting: Neurology

## 2019-02-20 NOTE — Telephone Encounter (Signed)
Unable to contact to r/s 1/11 appt due to MD being out.

## 2019-03-13 ENCOUNTER — Ambulatory Visit: Payer: Medicare Other | Admitting: Neurology

## 2019-03-28 ENCOUNTER — Inpatient Hospital Stay (HOSPITAL_COMMUNITY)
Admission: EM | Admit: 2019-03-28 | Discharge: 2019-04-05 | DRG: 177 | Disposition: A | Discharge status: Home Health | Payer: Medicare Other | Attending: Internal Medicine | Admitting: Internal Medicine

## 2019-03-28 ENCOUNTER — Emergency Department (HOSPITAL_COMMUNITY): Payer: Medicare Other

## 2019-03-28 ENCOUNTER — Encounter (HOSPITAL_COMMUNITY): Payer: Self-pay | Admitting: Emergency Medicine

## 2019-03-28 ENCOUNTER — Other Ambulatory Visit: Payer: Self-pay

## 2019-03-28 DIAGNOSIS — I2693 Single subsegmental pulmonary embolism without acute cor pulmonale: Secondary | ICD-10-CM | POA: Diagnosis not present

## 2019-03-28 DIAGNOSIS — G9341 Metabolic encephalopathy: Secondary | ICD-10-CM | POA: Diagnosis present

## 2019-03-28 DIAGNOSIS — I10 Essential (primary) hypertension: Secondary | ICD-10-CM | POA: Diagnosis present

## 2019-03-28 DIAGNOSIS — E860 Dehydration: Secondary | ICD-10-CM | POA: Diagnosis present

## 2019-03-28 DIAGNOSIS — I2699 Other pulmonary embolism without acute cor pulmonale: Secondary | ICD-10-CM | POA: Diagnosis present

## 2019-03-28 DIAGNOSIS — G20 Parkinson's disease: Secondary | ICD-10-CM | POA: Diagnosis present

## 2019-03-28 DIAGNOSIS — I2510 Atherosclerotic heart disease of native coronary artery without angina pectoris: Secondary | ICD-10-CM | POA: Diagnosis present

## 2019-03-28 DIAGNOSIS — T380X5A Adverse effect of glucocorticoids and synthetic analogues, initial encounter: Secondary | ICD-10-CM | POA: Diagnosis present

## 2019-03-28 DIAGNOSIS — Z79899 Other long term (current) drug therapy: Secondary | ICD-10-CM

## 2019-03-28 DIAGNOSIS — I4892 Unspecified atrial flutter: Secondary | ICD-10-CM

## 2019-03-28 DIAGNOSIS — J1282 Pneumonia due to coronavirus disease 2019: Secondary | ICD-10-CM | POA: Diagnosis present

## 2019-03-28 DIAGNOSIS — N179 Acute kidney failure, unspecified: Secondary | ICD-10-CM | POA: Diagnosis present

## 2019-03-28 DIAGNOSIS — Z66 Do not resuscitate: Secondary | ICD-10-CM | POA: Diagnosis present

## 2019-03-28 DIAGNOSIS — G20A1 Parkinson's disease without dyskinesia, without mention of fluctuations: Secondary | ICD-10-CM | POA: Diagnosis present

## 2019-03-28 DIAGNOSIS — U071 COVID-19: Secondary | ICD-10-CM | POA: Diagnosis not present

## 2019-03-28 DIAGNOSIS — E785 Hyperlipidemia, unspecified: Secondary | ICD-10-CM | POA: Diagnosis present

## 2019-03-28 DIAGNOSIS — I480 Paroxysmal atrial fibrillation: Secondary | ICD-10-CM | POA: Diagnosis present

## 2019-03-28 DIAGNOSIS — J9601 Acute respiratory failure with hypoxia: Secondary | ICD-10-CM | POA: Diagnosis present

## 2019-03-28 DIAGNOSIS — Z87891 Personal history of nicotine dependence: Secondary | ICD-10-CM

## 2019-03-28 DIAGNOSIS — R0602 Shortness of breath: Secondary | ICD-10-CM | POA: Diagnosis not present

## 2019-03-28 DIAGNOSIS — Z781 Physical restraint status: Secondary | ICD-10-CM

## 2019-03-28 DIAGNOSIS — F0280 Dementia in other diseases classified elsewhere without behavioral disturbance: Secondary | ICD-10-CM | POA: Diagnosis present

## 2019-03-28 DIAGNOSIS — Z7982 Long term (current) use of aspirin: Secondary | ICD-10-CM

## 2019-03-28 LAB — BASIC METABOLIC PANEL
Anion gap: 7 (ref 5–15)
BUN: 13 mg/dL (ref 8–23)
CO2: 25 mmol/L (ref 22–32)
Calcium: 8.7 mg/dL — ABNORMAL LOW (ref 8.9–10.3)
Chloride: 105 mmol/L (ref 98–111)
Creatinine, Ser: 1.23 mg/dL (ref 0.61–1.24)
GFR calc Af Amer: 59 mL/min — ABNORMAL LOW (ref >60)
GFR calc non Af Amer: 51 mL/min — ABNORMAL LOW (ref >60)
Glucose, Bld: 114 mg/dL — ABNORMAL HIGH (ref 70–99)
Potassium: 4.3 mmol/L (ref 3.5–5.1)
Sodium: 137 mmol/L (ref 135–145)

## 2019-03-28 LAB — CBC
HCT: 42.5 % (ref 39.0–52.0)
Hemoglobin: 13.3 g/dL (ref 13.0–17.0)
MCH: 30.1 pg (ref 26.0–34.0)
MCHC: 31.3 g/dL (ref 30.0–36.0)
MCV: 96.2 fL (ref 80.0–100.0)
Platelets: 204 10*3/uL (ref 150–400)
RBC: 4.42 MIL/uL (ref 4.22–5.81)
RDW: 14 % (ref 11.5–15.5)
WBC: 8.8 10*3/uL (ref 4.0–10.5)
nRBC: 0 % (ref 0.0–0.2)

## 2019-03-28 LAB — RESPIRATORY PANEL BY RT PCR (FLU A&B, COVID)
Influenza A by PCR: NEGATIVE
Influenza B by PCR: NEGATIVE
SARS Coronavirus 2 by RT PCR: POSITIVE — AB

## 2019-03-28 LAB — FIBRINOGEN: Fibrinogen: 524 mg/dL — ABNORMAL HIGH (ref 210–475)

## 2019-03-28 LAB — C-REACTIVE PROTEIN: CRP: 0.8 mg/dL (ref <1.0)

## 2019-03-28 LAB — LACTATE DEHYDROGENASE: LDH: 204 U/L — ABNORMAL HIGH (ref 98–192)

## 2019-03-28 LAB — TROPONIN I (HIGH SENSITIVITY)
Troponin I (High Sensitivity): 13 ng/L (ref <18)
Troponin I (High Sensitivity): 12 ng/L (ref <18)

## 2019-03-28 LAB — D-DIMER, QUANTITATIVE (NOT AT ARMC): D-Dimer, Quant: 6.64 ug/mL-FEU — ABNORMAL HIGH (ref 0.00–0.50)

## 2019-03-28 LAB — BRAIN NATRIURETIC PEPTIDE: B Natriuretic Peptide: 231.1 pg/mL — ABNORMAL HIGH (ref 0.0–100.0)

## 2019-03-28 LAB — TRIGLYCERIDES: Triglycerides: 62 mg/dL (ref <150)

## 2019-03-28 LAB — FERRITIN: Ferritin: 155 ng/mL (ref 24–336)

## 2019-03-28 MED ORDER — HEPARIN BOLUS VIA INFUSION
5700.0000 [IU] | Freq: Once | INTRAVENOUS | Status: AC
  Administered 2019-03-28: 5700 [IU] via INTRAVENOUS
  Filled 2019-03-28: qty 5700

## 2019-03-28 MED ORDER — IOHEXOL 350 MG/ML SOLN
80.0000 mL | Freq: Once | INTRAVENOUS | Status: AC | PRN
  Administered 2019-03-28: 80 mL via INTRAVENOUS

## 2019-03-28 MED ORDER — SODIUM CHLORIDE 0.9% FLUSH
3.0000 mL | Freq: Once | INTRAVENOUS | Status: AC
  Administered 2019-03-28: 3 mL via INTRAVENOUS

## 2019-03-28 MED ORDER — HEPARIN (PORCINE) 25000 UT/250ML-% IV SOLN
1300.0000 [IU]/h | INTRAVENOUS | Status: AC
  Administered 2019-03-28: 1400 [IU]/h via INTRAVENOUS
  Filled 2019-03-28: qty 250

## 2019-03-28 MED ORDER — SODIUM CHLORIDE 0.9 % IV SOLN
100.0000 mg | Freq: Every day | INTRAVENOUS | Status: AC
  Administered 2019-03-29 – 2019-04-01 (×4): 100 mg via INTRAVENOUS
  Filled 2019-03-28 (×5): qty 20

## 2019-03-28 MED ORDER — SODIUM CHLORIDE 0.9 % IV SOLN
200.0000 mg | Freq: Once | INTRAVENOUS | Status: AC
  Administered 2019-03-29: 200 mg via INTRAVENOUS
  Filled 2019-03-28: qty 40

## 2019-03-28 NOTE — Progress Notes (Signed)
Received report from ED.  

## 2019-03-28 NOTE — ED Provider Notes (Signed)
[place] EMERGENCY DEPARTMENT Provider Note   CSN: [id number] Arrival date & time: 03/28/19  1322     History Chief Complaint  Patient presents with  . Chest Pain    Alexander Novak is a 86 y.o. male.  86yo M w/ PMH including dementia, Parkinson's, PVD, CAD, AAA, HTN, HLD who p/w chest pain. Wife reports that patient has had 3 episodes of chest pain over the past 3 days. The first episode began after he walked to the mailbox and back. Pain eventually resolved. He states the other episodes have happened while walking to mailbox. He came to wife today and asked to go to hospital. He denies any pain currently. Wife reports a deep cough for the past few days but no vomiting, fevers, or other illness symptoms. No sick contacts. He received 1st COVID-19 vaccine on 1/19. Wife reports he gives himself his own medications, she assumes he is compliant.  LEVEL 5 CAVEAT DUE TO DEMENTIA  The history is provided by the patient and the spouse.  Chest Pain      Past Medical History:  Diagnosis Date  . Abdominal aortic aneurysm ([place])    repaired by Dr. Amedeo Plenty  . CAD (coronary artery disease)    Acute inferior infarct in 1992. PTCA of her current symptoms in 1997 with a distal occluded AV circumflex. He had a proximal 95% stenosis that was treated with PCI)  . Dementia ([place])   . HTN (hypertension)   . Hyperlipidemia   . Parkinson disease ([place])   . PVD (peripheral vascular disease) ([place])    Bilateral femoral artery occlusion managed medically    Patient Active Problem List   Diagnosis Date Noted  . Parkinsonism ([place]) 02/26/2016  . Dementia ([place]) 07/06/2013  . DYSLIPIDEMIA 03/25/2009  . ESSENTIAL HYPERTENSION, BENIGN 03/25/2009  . CORONARY ATHEROSCLEROSIS NATIVE CORONARY ARTERY 03/25/2009  . Abnormal involuntary movement 03/25/2009    Past Surgical History:  Procedure Laterality Date  . ABDOMINAL AORTIC ANEURYSM REPAIR         Family History  Problem  Relation Age of Onset  . Pneumonia Mother   . Stroke Father   . Heart disease Other     Social History   Tobacco Use  . Smoking status: Former Smoker    Quit date: 04/02/1979    Years since quitting: 40.0  . Smokeless tobacco: Never Used  Substance Use Topics  . Alcohol use: No  . Drug use: No    Home Medications Prior to Admission medications   Medication Sig Start Date End Date Taking? Authorizing Provider  Ascorbic Acid (VITAMIN C) 1000 MG tablet Take 1,000 mg by mouth 2 (two) times daily.    Yes [provider]  aspirin EC 81 MG tablet Take 81 mg by mouth daily.   Yes [provider]  B Complex-Biotin-FA (B COMPLETE) TABS Take 1 tablet by mouth daily.   Yes [provider]  carbidopa-levodopa (SINEMET IR) 25-100 MG tablet Take 1 tablet by mouth 3 (three) times daily. Patient taking differently: Take 1 tablet by mouth 3 (three) times daily before meals.  03/08/18  Yes Dennie Bible, NP  Coenzyme Q10 (CO Q10) 100 MG CAPS Take 100 mg by mouth daily.   Yes [provider]  donepezil (ARICEPT) 10 MG tablet Take 1 tablet (10 mg total) by mouth daily. 03/08/18  Yes Dennie Bible, NP  fish oil-omega-3 fatty acids 1000 MG capsule Take 2 g by mouth daily.   Yes  [provider]  Ginkgo 60 MG TABS Take 120 mg by mouth 2 (two) times daily.    Yes [provider]  NON FORMULARY Take 1 capsule by mouth See admin instructions. Procera Advanced Brain - 3-in-1 Nootropic Brain Supplement  Memory & Mood Support w/Energy Vitamins  Ashwagandha, Rhodiola, Ginseng, Ginkgo, Phosphatidylserine & Vitamin B Complex: Take 1 capsule by mouth once a day   Yes [provider]  OVER THE COUNTER MEDICATION Take 1 tablet by mouth See admin instructions. [employer] sleeping tablets (generic Unisom, Benadryl, or Melatonin??): Take 1 tablet by mouth at bedtime as needed for sleep   Yes [provider]  rasagiline (AZILECT) 1 MG TABS tablet  Take 1 tablet (1 mg total) by mouth daily. 03/08/18  Yes Nilda Riggs, NP  simethicone (MYLICON) 80 MG chewable tablet Chew 80 mg by mouth 3 (three) times daily after meals.    Yes [provider]  UNABLE TO FIND Take 1 capsule by mouth See admin instructions. Med Name: Immune Health Support capsule: Take 1 capsule by mouth once a day   Yes [provider]  vitamin E 400 UNIT capsule Take 400 Units by mouth daily.   Yes [provider]  LevOCARNitine (CARNITINE PO) Take 500 mg by mouth daily.    [provider]  simvastatin (ZOCOR) 40 MG tablet TAKE 1 TABLET BY MOUTH EVERY DAY Patient not taking: Reported on 03/28/2019 11/30/18   Rollene Rotunda, MD  UNABLE TO FIND Take 250 mg by mouth daily. Med Name:magnesium 500mg  po qid     [provider]    Allergies    Patient has no known allergies.  Review of Systems   Review of Systems  Unable to perform ROS: Dementia  Cardiovascular: Positive for chest pain.    Physical Exam Updated Vital Signs BP 129/64   Pulse 64   Temp 97.7 F (36.5 C) (Oral)   Resp 19   Ht 5\' 10"  (1.778 m)   Wt 81.6 kg   SpO2 96%   BMI 25.83 kg/m   Physical Exam Vitals and nursing note reviewed.  Constitutional:      General: He is not in acute distress.    Appearance: He is well-developed.  HENT:     Head: Normocephalic and atraumatic.  Eyes:     Conjunctiva/sclera: Conjunctivae normal.  Cardiovascular:     Rate and Rhythm: Normal rate and regular rhythm.     Heart sounds: Normal heart sounds. No murmur.  Pulmonary:     Effort: Pulmonary effort is normal.     Breath sounds: Normal breath sounds.  Abdominal:     General: There is no distension.     Palpations: Abdomen is soft.     Tenderness: There is no abdominal tenderness.  Musculoskeletal:     Cervical back: Neck supple.     Comments: Trace edema b/l ankles  Skin:    General: Skin is warm and dry.  Neurological:     Mental Status: He is  alert.     Comments: Oriented x 2, hard of hearing  Psychiatric:        Judgment: Judgment normal.     ED Results / Procedures / Treatments   Labs (all labs ordered are listed, but only abnormal results are displayed) Labs Reviewed  RESPIRATORY PANEL BY RT PCR (FLU A&B, COVID) - Abnormal; Notable for the following components:      Result Value   SARS Coronavirus 2 by RT PCR POSITIVE (*)  All other components within normal limits  BASIC METABOLIC PANEL - Abnormal; Notable for the following components:   Glucose, Bld 114 (*)    Calcium 8.7 (*)    GFR calc non Af Amer 51 (*)    GFR calc Af Amer 59 (*)    All other components within normal limits  D-DIMER, QUANTITATIVE (NOT AT [facility]) - Abnormal; Notable for the following components:   D-Dimer, Quant 6.64 (*)    All other components within normal limits  CBC  PROCALCITONIN  LACTATE DEHYDROGENASE  FERRITIN  TRIGLYCERIDES  FIBRINOGEN  C-REACTIVE PROTEIN  BRAIN NATRIURETIC PEPTIDE  HEPARIN LEVEL (UNFRACTIONATED)  CBC  TROPONIN I (HIGH SENSITIVITY)  TROPONIN I (HIGH SENSITIVITY)    EKG EKG Interpretation  Date/Time:  Tuesday March 28 2019 13:31:30 EST Ventricular Rate:  89 PR Interval:  226 QRS Duration: 86 QT Interval:  386 QTC Calculation: 469 R Axis:   111 Text Interpretation: Undetermined rhythm Left posterior fascicular block Inferior infarct , age undetermined Cannot rule out Anterior infarct , age undetermined Abnormal ECG No previous ECGs available Confirmed by Frederick Peers [phone]) on 03/28/2019 3:03:39 PM   Radiology DG Chest 2 View  Result Date: 03/28/2019 CLINICAL DATA:  Chest pain EXAM: CHEST - 2 VIEW COMPARISON:  2014 FINDINGS: Increased chronic interstitial prominence. No pleural effusion or pneumothorax. Possible pleural calcification at the left lung base. Cardiomediastinal contours are within normal limits with normal heart size. There is mild calcified plaque along the thoracic aorta. Surgical clips  overlie the upper abdomen. No acute osseous abnormality. IMPRESSION: Increased interstitial prominence, which may reflect interstitial lung disease. Electronically Signed   By: Guadlupe Spanish M.D.   On: 03/28/2019 14:10   CT Angio Chest PE W/Cm &/Or Wo Cm  Result Date: 03/28/2019 CLINICAL DATA:  Intermittent chest pain and elevated D-dimer. EXAM: CT ANGIOGRAPHY CHEST WITH CONTRAST TECHNIQUE: Multidetector CT imaging of the chest was performed using the standard protocol during bolus administration of intravenous contrast. Multiplanar CT image reconstructions and MIPs were obtained to evaluate the vascular anatomy. CONTRAST:  39mL OMNIPAQUE IOHEXOL 350 MG/ML SOLN COMPARISON:  None. FINDINGS: Cardiovascular: There is moderate to marked severity calcification of the thoracic aorta. A very small amount of intraluminal low attenuation is seen within and upper lobe branch of the left pulmonary artery (axial CT image 43, CT series number 5/sagittal reformatted image 127, CT series number 9). Normal heart size. No pericardial effusion. Marked severity coronary artery calcification is seen. Mediastinum/Nodes: No enlarged mediastinal, hilar, or axillary lymph nodes. Thyroid gland, trachea, and esophagus demonstrate no significant findings. Lungs/Pleura: Mild emphysematous lung disease is seen involving the bilateral upper lobes. Mild to moderate severity patchy areas of atelectasis and/or infiltrate are seen along the periphery of both lungs. There is no evidence of a pleural effusion or pneumothorax. Upper Abdomen: Numerous subcentimeter gallstones are seen within the lumen of an otherwise normal-appearing gallbladder. Musculoskeletal: Multilevel degenerative changes are seen throughout the thoracic spine Review of the MIP images confirms the above findings. IMPRESSION: 1. Very small amount of pulmonary embolism seen involving an upper lobe branch of the left pulmonary artery. 2. Mild to moderate severity patchy  bilateral atelectasis and/or infiltrates. 3. Cholelithiasis. Electronically Signed   By: Aram Candela M.D.   On: 03/28/2019 21:29    Procedures Procedures (including critical care time) CRITICAL CARE Performed by: Ambrose [place] Shukri Nistler   Total critical care time: 30 minutes  Critical care time was exclusive of separately billable procedures and treating other patients.  Critical care was necessary to treat or prevent imminent or life-threatening deterioration.  Critical care was time spent personally by me on the following activities: development of treatment plan with patient and/or surrogate as well as nursing, discussions with consultants, evaluation of patient's response to treatment, examination of patient, obtaining history from patient or surrogate, ordering and performing treatments and interventions, ordering and review of laboratory studies, ordering and review of radiographic studies, pulse oximetry and re-evaluation of patient's condition.  Medications Ordered in ED Medications  sodium chloride flush (NS) 0.9 % injection 3 mL (3 mLs Intravenous Given 03/28/19 1455)    ED Course  I have reviewed the triage vital signs and the nursing notes.  Pertinent labs & imaging results that were available during my care of the patient were reviewed by me and considered in my medical decision making (see chart for details).    MDM Rules/Calculators/A&P                      Comfortable and pain-free on exam with reassuring vital signs.  Initial EKG indeterminate rhythm, no ischemic changes.  Chest x-ray shows some interstitial prominence, no focal infiltrate or effusions. Labs including trop unremarkable. I have considered the possibility of ACS/angina as cause of symptoms, especially given h/o CAD, however given pt's advanced age and comorbidities, I doubt he would be candidate for immediate catherization given negative trops and non-ischemic EKG with no current symptoms.  Pt's  COVID-19 test later came back positive. Given known association w/ clots and pt's report of pleuritic pain, obtained D-dimer which was elevated. CTA shows small PE in LUL, no R heart strain. Patchy infiltrates c/w COVID infection. Discussed admission w/ [facility], Dr. Antionette Char, who will order anticoagulation. I appreciate his assistance with the patient's care.   CHRISOTPHER RIVERO was evaluated in Emergency Department on 03/28/2019 for the symptoms described in the history of present illness. He was evaluated in the context of the global COVID-19 pandemic, which necessitated consideration that the patient might be at risk for infection with the SARS-CoV-2 virus that causes COVID-19. Institutional protocols and algorithms that pertain to the evaluation of patients at risk for COVID-19 are in a state of rapid change based on information released by regulatory bodies including the CDC and federal and state organizations. These policies and algorithms were followed during the patient's care in the ED.  Final Clinical Impression(s) / ED Diagnoses Final diagnoses:  Other acute pulmonary embolism without acute cor pulmonale (HCC)  COVID-19 virus infection    Rx / DC Orders ED Discharge Orders    None       Sherron Mummert, Ambrose [place], MD 03/28/19 2240

## 2019-03-28 NOTE — ED Notes (Signed)
Wife no longer at bedside, will call when pt is to be discharged home. Pt os active in bed, requires frequent redirection to stay in bed and keep on cords for monitoring.

## 2019-03-28 NOTE — Progress Notes (Signed)
ANTICOAGULATION CONSULT NOTE - Initial Consult  Pharmacy Consult for Heparin Indication: pulmonary embolus  No Known Allergies  Patient Measurements: Height: 5\' 10"  (177.8 cm) Weight: 180 lb (81.6 kg) IBW/kg (Calculated) : 73 Heparin Dosing Weight: 81.6 kg  Vital Signs: Temp: 97.7 F (36.5 C) (01/26 1326) Temp Source: Oral (01/26 1326) BP: 128/68 (01/26 2205) Pulse Rate: 66 (01/26 1930)  Labs: Recent Labs    03/28/19 1337 03/28/19 1537  HGB 13.3  --   HCT 42.5  --   PLT 204  --   CREATININE 1.23  --   TROPONINIHS 13 12    Estimated Creatinine Clearance: 40.4 mL/min (by C-G formula based on SCr of 1.23 mg/dL).   Medical History: Past Medical History:  Diagnosis Date  . Abdominal aortic aneurysm ([facility])    repaired by Dr. 03/30/19  . CAD (coronary artery disease)    Acute inferior infarct in 1992. PTCA of her current symptoms in 1997 with a distal occluded AV circumflex. He had a proximal 95% stenosis that was treated with PCI)  . Dementia ([facility])   . HTN (hypertension)   . Hyperlipidemia   . Parkinson disease ([facility])   . PVD (peripheral vascular disease) ([facility])    Bilateral femoral artery occlusion managed medically    Medications:  Scheduled:  . heparin  5,700 Units Intravenous Once    Assessment: - Patient is a 35 yom that presented to the ED with c/o CP. The patient was found to be COVID + and also positive for a PE. Pharmacy has been asked to dose heparin in this patient.   Goal of Therapy:  Heparin level 0.3-0.7 units/ml Monitor platelets by anticoagulation protocol: Yes   Plan:  - Heparin 5700 units IV x 1 dose  - Followed by Heparin 1400 units/hr  - Heparin level in 6 hours (AM labs)  - Monitor patient for s/s of bleeding and CBC while on heparin   82 PharmD. BCPS  03/28/2019,10:22 PM

## 2019-03-28 NOTE — ED Triage Notes (Signed)
Pt reports central chest pain worse with walking. Cough for the last couple weeks, denies fever. VSS.

## 2019-03-28 NOTE — H&P (Signed)
History and Physical    STRUMMER CANIPE KDX:[id number] DOB: 1925-12-17 DOA: 03/28/2019  PCP: Mila Palmer, MD   Patient coming from: Home   Chief Complaint: Chest pain, cough   HPI: Alexander Novak is a 86 y.o. male with medical history significant for CAD, hypertension, Parkinson's disease, and memory loss, now presenting to the emergency department with 3 days of chest pain and cough.  Patient's wife assists with the history.  Patient been in his usual state until 3 days ago when he developed a cough that has been frequent and nonproductive, and has also been complaining of episodic chest pain.  He has not complained of fevers or chills and there has not been any leg swelling or tenderness.    ED Course: Upon arrival to the ED, patient is found to be afebrile, saturating low 90s on room air, slightly tachypneic, and with stable blood pressure.  EKG features sinus rhythm.  Chest x-ray with increased interstitial prominence.  Chemistry panel and CBC unremarkable, high-sensitivity troponin normal x2, D-dimer elevated to 6.64, and Covid PCR is positive.  CTA chest is notable for small PE.  Hospitalist consulted for admission.  Review of Systems:  Unable to complete ROS secondary the patient's clinical condition.  Past Medical History:  Diagnosis Date  . Abdominal aortic aneurysm (HCC)    repaired by Dr. Madilyn Fireman  . CAD (coronary artery disease)    Acute inferior infarct in 1992. PTCA of her current symptoms in 1997 with a distal occluded AV circumflex. He had a proximal 95% stenosis that was treated with PCI)  . Dementia (HCC)   . HTN (hypertension)   . Hyperlipidemia   . Parkinson disease (HCC)   . PVD (peripheral vascular disease) (HCC)    Bilateral femoral artery occlusion managed medically    Past Surgical History:  Procedure Laterality Date  . ABDOMINAL AORTIC ANEURYSM REPAIR       reports that he quit smoking about 40 years ago. He has never used smokeless tobacco. He  reports that he does not drink alcohol or use drugs.  No Known Allergies  Family History  Problem Relation Age of Onset  . Pneumonia Mother   . Stroke Father   . Heart disease Other      Prior to Admission medications   Medication Sig Start Date End Date Taking? Authorizing Provider  Ascorbic Acid (VITAMIN C) 1000 MG tablet Take 1,000 mg by mouth 2 (two) times daily.    Yes [provider]  aspirin EC 81 MG tablet Take 81 mg by mouth daily.   Yes [provider]  B Complex-Biotin-FA (B COMPLETE) TABS Take 1 tablet by mouth daily.   Yes [provider]  carbidopa-levodopa (SINEMET IR) 25-100 MG tablet Take 1 tablet by mouth 3 (three) times daily. Patient taking differently: Take 1 tablet by mouth 3 (three) times daily before meals.  03/08/18  Yes Nilda Riggs, NP  Coenzyme Q10 (CO Q10) 100 MG CAPS Take 100 mg by mouth daily.   Yes [provider]  donepezil (ARICEPT) 10 MG tablet Take 1 tablet (10 mg total) by mouth daily. 03/08/18  Yes Nilda Riggs, NP  fish oil-omega-3 fatty acids 1000 MG capsule Take 2 g by mouth daily.   Yes [provider]  Ginkgo 60 MG TABS Take 120 mg by mouth 2 (two) times daily.    Yes [provider]  Magnesium 250 MG TABS Take 250-500 mg by mouth 4 (four) times daily.  Yes [provider]  NON FORMULARY Take 1 capsule by mouth See admin instructions. Procera Advanced Brain - 3-in-1 Nootropic Brain Supplement  Memory & Mood Support w/Energy Vitamins  Ashwagandha, Rhodiola, Ginseng, Ginkgo, Phosphatidylserine & Vitamin B Complex: Take 1 capsule by mouth once a day   Yes [provider]  OVER THE COUNTER MEDICATION Take 1 tablet by mouth See admin instructions. [employer] sleeping tablets (generic Unisom, Benadryl, or Melatonin??): Take 1 tablet by mouth at bedtime as needed for sleep   Yes [provider]  rasagiline (AZILECT) 1 MG TABS tablet Take 1 tablet (1 mg total) by  mouth daily. 03/08/18  Yes Nilda Riggs, NP  simethicone (MYLICON) 80 MG chewable tablet Chew 80 mg by mouth 3 (three) times daily after meals.    Yes [provider]  UNABLE TO FIND Take 1 capsule by mouth See admin instructions. Med Name: Immune Health Support capsule: Take 1 capsule by mouth once a day   Yes [provider]  vitamin E 400 UNIT capsule Take 400 Units by mouth daily.   Yes [provider]  LevOCARNitine (CARNITINE PO) Take 500 mg by mouth daily.    [provider]  simvastatin (ZOCOR) 40 MG tablet TAKE 1 TABLET BY MOUTH EVERY DAY Patient not taking: Reported on 03/28/2019 11/30/18   Rollene Rotunda, MD    Physical Exam: Vitals:   03/28/19 2208 03/28/19 2246 03/28/19 2248 03/28/19 2251  BP:  136/69    Pulse:  68    Resp:  20    Temp:      TempSrc:      SpO2: 94% 92% 94% 94%  Weight:      Height:         Constitutional: NAD, calm  Eyes: PERTLA, lids and conjunctivae normal ENMT: Mucous membranes are moist. Posterior pharynx clear of any exudate or lesions.   Neck: normal, supple, no masses, no thyromegaly Respiratory: mild tachypnea, no wheezing, no crackles. No accessory muscle use.  Cardiovascular: S1 & S2 heard, regular rate and rhythm. No extremity edema. No significant JVD. Abdomen: No distension, no tenderness, soft. Bowel sounds active.  Musculoskeletal: no clubbing / cyanosis. No joint deformity upper and lower extremities.   Skin: no significant rashes, lesions, ulcers. Warm, dry, well-perfused. Neurologic: CN 2-12 grossly intact. Sensation intact. Resting UE tremor. Moving all extremities.  Psychiatric: Alert, oriented to person only. Pleasant and cooperative.    Labs and Imaging on Admission: I have personally reviewed following labs and imaging studies  CBC: Recent Labs  Lab 03/28/19 1337  WBC 8.8  HGB 13.3  HCT 42.5  MCV 96.2  PLT 204   Basic Metabolic Panel: Recent Labs  Lab 03/28/19 1337  NA  137  K 4.3  CL 105  CO2 25  GLUCOSE 114*  BUN 13  CREATININE 1.23  CALCIUM 8.7*   GFR: Estimated Creatinine Clearance: 40.4 mL/min (by C-G formula based on SCr of 1.23 mg/dL). Liver Function Tests: No results for input(s): AST, ALT, ALKPHOS, BILITOT, PROT, ALBUMIN in the last 168 hours. No results for input(s): LIPASE, AMYLASE in the last 168 hours. No results for input(s): AMMONIA in the last 168 hours. Coagulation Profile: No results for input(s): INR, PROTIME in the last 168 hours. Cardiac Enzymes: No results for input(s): CKTOTAL, CKMB, CKMBINDEX, TROPONINI in the last 168 hours. BNP (last 3 results) No results for input(s): PROBNP in the last 8760 hours. HbA1C: No results for input(s): HGBA1C in the last 72 hours.  CBG: No results for input(s): GLUCAP in the last 168 hours. Lipid Profile: No results for input(s): CHOL, HDL, LDLCALC, TRIG, CHOLHDL, LDLDIRECT in the last 72 hours. Thyroid Function Tests: No results for input(s): TSH, T4TOTAL, FREET4, T3FREE, THYROIDAB in the last 72 hours. Anemia Panel: No results for input(s): VITAMINB12, FOLATE, FERRITIN, TIBC, IRON, RETICCTPCT in the last 72 hours. Urine analysis: No results found for: COLORURINE, APPEARANCEUR, LABSPEC, PHURINE, GLUCOSEU, HGBUR, BILIRUBINUR, KETONESUR, PROTEINUR, UROBILINOGEN, NITRITE, LEUKOCYTESUR Sepsis Labs: @LABRCNTIP (procalcitonin:4,lacticidven:4) ) Recent Results (from the past 240 hour(s))  Respiratory Panel by RT PCR (Flu A&B, Covid) - Nasopharyngeal Swab     Status: Abnormal   Collection Time: 03/28/19  3:37 PM   Specimen: Nasopharyngeal Swab  Result Value Ref Range Status   SARS Coronavirus 2 by RT PCR POSITIVE (A) NEGATIVE Final    Comment: RESULT CALLED TO, READ BACK BY AND VERIFIED WITH: 03/30/19 RN 17:00 03/28/19 ([email or web address]) (NOTE) SARS-CoV-2 target nucleic acids are DETECTED. SARS-CoV-2 RNA is generally detectable in upper respiratory specimens  during the acute phase of infection.  Positive results are indicative of the presence of the identified virus, but do not rule out bacterial infection or co-infection with other pathogens not detected by the test. Clinical correlation with patient history and other diagnostic information is necessary to determine patient infection status. The expected result is Negative. Fact Sheet for Patients:  03/30/19 Fact Sheet for Healthcare Providers: https://www.moore.com/ This test is not yet approved or cleared by the https://www.young.biz/ FDA and  has been authorized for detection and/or diagnosis of SARS-CoV-2 by FDA under an Emergency Use Authorization (EUA).  This EUA will remain in effect (meaning this test can be used) fo r the duration of  the COVID-19 declaration under Section 564(b)(1) of the Act, 21 U.S.C. section 360bbb-3(b)(1), unless the authorization is terminated or revoked sooner.    Influenza A by PCR NEGATIVE NEGATIVE Final   Influenza B by PCR NEGATIVE NEGATIVE Final    Comment: (NOTE) The Xpert Xpress SARS-CoV-2/FLU/RSV assay is intended as an aid in  the diagnosis of influenza from Nasopharyngeal swab specimens and  should not be used as a sole basis for treatment. Nasal washings and  aspirates are unacceptable for Xpert Xpress SARS-CoV-2/FLU/RSV  testing. Fact Sheet for Patients: [place] Fact Sheet for Healthcare Providers: https://www.moore.com/ This test is not yet approved or cleared by the https://www.young.biz/ FDA and  has been authorized for detection and/or diagnosis of SARS-CoV-2 by  FDA under an Emergency Use Authorization (EUA). This EUA will remain  in effect (meaning this test can be used) for the duration of the  Covid-19 declaration under Section 564(b)(1) of the Act, 21  U.S.C. section 360bbb-3(b)(1), unless the authorization is  terminated or revoked. Performed at [facility], [address], [place], [place]      Radiological Exams on Admission: DG Chest 2 View  Result Date: 03/28/2019 CLINICAL DATA:  Chest pain EXAM: CHEST - 2 VIEW COMPARISON:  2014 FINDINGS: Increased chronic interstitial prominence. No pleural effusion or pneumothorax. Possible pleural calcification at the left lung base. Cardiomediastinal contours are within normal limits with normal heart size. There is mild calcified plaque along the thoracic aorta. Surgical clips overlie the upper abdomen. No acute osseous abnormality. IMPRESSION: Increased interstitial prominence, which may reflect interstitial lung disease. Electronically Signed   By: 2015 M.D.   On: 03/28/2019 14:10   CT Angio Chest PE W/Cm &/Or Wo Cm  Result Date: 03/28/2019 CLINICAL DATA:  Intermittent chest pain and elevated D-dimer. EXAM: CT ANGIOGRAPHY CHEST WITH CONTRAST TECHNIQUE: Multidetector CT imaging of the chest was performed using the standard protocol during bolus administration of intravenous contrast. Multiplanar CT image reconstructions and MIPs were obtained to evaluate the vascular anatomy. CONTRAST:  68mL OMNIPAQUE IOHEXOL 350 MG/ML SOLN COMPARISON:  None. FINDINGS: Cardiovascular: There is moderate to marked severity calcification of the thoracic aorta. A very small amount of intraluminal low attenuation is seen within and upper lobe branch of the left pulmonary artery (axial CT image 43, CT series number 5/sagittal reformatted image 127, CT series number 9). Normal heart size. No pericardial effusion. Marked severity coronary artery calcification is seen. Mediastinum/Nodes: No enlarged mediastinal, hilar, or axillary lymph nodes. Thyroid gland, trachea, and esophagus demonstrate no significant findings. Lungs/Pleura: Mild emphysematous lung disease is seen involving the bilateral upper lobes. Mild to moderate severity patchy areas of atelectasis and/or infiltrate are seen along the periphery of both lungs. There is no  evidence of a pleural effusion or pneumothorax. Upper Abdomen: Numerous subcentimeter gallstones are seen within the lumen of an otherwise normal-appearing gallbladder. Musculoskeletal: Multilevel degenerative changes are seen throughout the thoracic spine Review of the MIP images confirms the above findings. IMPRESSION: 1. Very small amount of pulmonary embolism seen involving an upper lobe branch of the left pulmonary artery. 2. Mild to moderate severity patchy bilateral atelectasis and/or infiltrates. 3. Cholelithiasis. Electronically Signed   By: Virgina Norfolk M.D.   On: 03/28/2019 21:29    EKG: Independently reviewed. Sinus rhythm, rate 63, 1st degree AV block.   Assessment/Plan   1. Acute PE  - Presents with 3 days of chest pain and cough, found to have COVID-19 and small PE  - HS troponin is negative x2, BNP pending  - No leg swelling or tenderness  - Likely precipitated by COVID infection  - Check BNP, continue cardiac monitoring, start anticoagulation with IV heparin, consider transition to oral [facility] tomorrow    2. COVID-19 infection  - COVID-19 pcr is positive  - Patient has 3 days of cough, patchy bilateral pulm opacities on imaging likely reflects viral process though only mild tachypnea and no hypoxia while at rest  - Check markers, start remdesivir, start Decadron if he becomes hypoxic, continue isolation and supportive care     3. Parkinson's disease  - Continue Sinemet, Azilect   4. CAD  - Patient presents with chest pain, EKG without acute ischemic features, HS troponin wnl x2, CTA notable for PE and chest pain likely secondary to this     DVT prophylaxis: IV heparin  Code Status: DNR, patient confused but confirmed with this with wife  Family Communication: Spouse updated by phone   Consults called: None  Admission status: Observation     Vianne Bulls, MD [facility] Pager: See [email or web address]  If 7AM-7PM, please contact the daytime  attending [email or web address]  03/28/2019, 10:52 PM

## 2019-03-28 NOTE — ED Notes (Signed)
Pt wife mentions that pt has been "coughing a real deep cough for 3 days. 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

## 2019-03-29 DIAGNOSIS — I2510 Atherosclerotic heart disease of native coronary artery without angina pectoris: Secondary | ICD-10-CM | POA: Diagnosis present

## 2019-03-29 DIAGNOSIS — R0602 Shortness of breath: Secondary | ICD-10-CM | POA: Diagnosis present

## 2019-03-29 DIAGNOSIS — E860 Dehydration: Secondary | ICD-10-CM | POA: Diagnosis present

## 2019-03-29 DIAGNOSIS — Z781 Physical restraint status: Secondary | ICD-10-CM | POA: Diagnosis not present

## 2019-03-29 DIAGNOSIS — Z79899 Other long term (current) drug therapy: Secondary | ICD-10-CM | POA: Diagnosis not present

## 2019-03-29 DIAGNOSIS — F0280 Dementia in other diseases classified elsewhere without behavioral disturbance: Secondary | ICD-10-CM | POA: Diagnosis present

## 2019-03-29 DIAGNOSIS — I2693 Single subsegmental pulmonary embolism without acute cor pulmonale: Secondary | ICD-10-CM

## 2019-03-29 DIAGNOSIS — Z66 Do not resuscitate: Secondary | ICD-10-CM | POA: Diagnosis present

## 2019-03-29 DIAGNOSIS — G9341 Metabolic encephalopathy: Secondary | ICD-10-CM | POA: Diagnosis present

## 2019-03-29 DIAGNOSIS — N179 Acute kidney failure, unspecified: Secondary | ICD-10-CM | POA: Diagnosis present

## 2019-03-29 DIAGNOSIS — Z7982 Long term (current) use of aspirin: Secondary | ICD-10-CM | POA: Diagnosis not present

## 2019-03-29 DIAGNOSIS — I480 Paroxysmal atrial fibrillation: Secondary | ICD-10-CM | POA: Diagnosis present

## 2019-03-29 DIAGNOSIS — G20 Parkinson's disease: Secondary | ICD-10-CM | POA: Diagnosis present

## 2019-03-29 DIAGNOSIS — T380X5A Adverse effect of glucocorticoids and synthetic analogues, initial encounter: Secondary | ICD-10-CM | POA: Diagnosis present

## 2019-03-29 DIAGNOSIS — U071 COVID-19: Secondary | ICD-10-CM | POA: Diagnosis present

## 2019-03-29 DIAGNOSIS — I10 Essential (primary) hypertension: Secondary | ICD-10-CM | POA: Diagnosis present

## 2019-03-29 DIAGNOSIS — E785 Hyperlipidemia, unspecified: Secondary | ICD-10-CM | POA: Diagnosis present

## 2019-03-29 DIAGNOSIS — Z87891 Personal history of nicotine dependence: Secondary | ICD-10-CM | POA: Diagnosis not present

## 2019-03-29 DIAGNOSIS — I2699 Other pulmonary embolism without acute cor pulmonale: Secondary | ICD-10-CM | POA: Diagnosis present

## 2019-03-29 DIAGNOSIS — I4892 Unspecified atrial flutter: Secondary | ICD-10-CM | POA: Diagnosis not present

## 2019-03-29 DIAGNOSIS — I2782 Chronic pulmonary embolism: Secondary | ICD-10-CM | POA: Diagnosis not present

## 2019-03-29 DIAGNOSIS — I4891 Unspecified atrial fibrillation: Secondary | ICD-10-CM | POA: Diagnosis not present

## 2019-03-29 DIAGNOSIS — I484 Atypical atrial flutter: Secondary | ICD-10-CM | POA: Diagnosis not present

## 2019-03-29 DIAGNOSIS — J9601 Acute respiratory failure with hypoxia: Secondary | ICD-10-CM | POA: Diagnosis present

## 2019-03-29 DIAGNOSIS — J1282 Pneumonia due to coronavirus disease 2019: Secondary | ICD-10-CM | POA: Diagnosis present

## 2019-03-29 LAB — D-DIMER, QUANTITATIVE: D-Dimer, Quant: 6.92 ug/mL-FEU — ABNORMAL HIGH (ref 0.00–0.50)

## 2019-03-29 LAB — FERRITIN: Ferritin: 177 ng/mL (ref 24–336)

## 2019-03-29 LAB — PROCALCITONIN: Procalcitonin: <0.1 ng/mL

## 2019-03-29 LAB — COMPREHENSIVE METABOLIC PANEL
ALT: 20 U/L (ref 0–44)
AST: 28 U/L (ref 15–41)
Albumin: 2.6 g/dL — ABNORMAL LOW (ref 3.5–5.0)
Alkaline Phosphatase: 79 U/L (ref 38–126)
Anion gap: 8 (ref 5–15)
BUN: 9 mg/dL (ref 8–23)
CO2: 23 mmol/L (ref 22–32)
Calcium: 8.4 mg/dL — ABNORMAL LOW (ref 8.9–10.3)
Chloride: 106 mmol/L (ref 98–111)
Creatinine, Ser: 1.07 mg/dL (ref 0.61–1.24)
GFR calc Af Amer: >60 mL/min (ref >60)
GFR calc non Af Amer: >60 mL/min (ref >60)
Glucose, Bld: 101 mg/dL — ABNORMAL HIGH (ref 70–99)
Potassium: 4.3 mmol/L (ref 3.5–5.1)
Sodium: 137 mmol/L (ref 135–145)
Total Bilirubin: 0.7 mg/dL (ref 0.3–1.2)
Total Protein: 6.5 g/dL (ref 6.5–8.1)

## 2019-03-29 LAB — CBC WITH DIFFERENTIAL/PLATELET
Abs Immature Granulocytes: 0.04 10*3/uL (ref 0.00–0.07)
Basophils Absolute: 0 10*3/uL (ref 0.0–0.1)
Basophils Relative: 0 %
Eosinophils Absolute: 1 10*3/uL — ABNORMAL HIGH (ref 0.0–0.5)
Eosinophils Relative: 11 %
HCT: 38.5 % — ABNORMAL LOW (ref 39.0–52.0)
Hemoglobin: 12.8 g/dL — ABNORMAL LOW (ref 13.0–17.0)
Immature Granulocytes: 0 %
Lymphocytes Relative: 20 %
Lymphs Abs: 1.8 10*3/uL (ref 0.7–4.0)
MCH: 30.8 pg (ref 26.0–34.0)
MCHC: 33.2 g/dL (ref 30.0–36.0)
MCV: 92.5 fL (ref 80.0–100.0)
Monocytes Absolute: 0.9 10*3/uL (ref 0.1–1.0)
Monocytes Relative: 10 %
Neutro Abs: 5.2 10*3/uL (ref 1.7–7.7)
Neutrophils Relative %: 59 %
Platelets: 192 10*3/uL (ref 150–400)
RBC: 4.16 MIL/uL — ABNORMAL LOW (ref 4.22–5.81)
RDW: 13.8 % (ref 11.5–15.5)
WBC: 8.9 10*3/uL (ref 4.0–10.5)
nRBC: 0 % (ref 0.0–0.2)

## 2019-03-29 LAB — HEPARIN LEVEL (UNFRACTIONATED): Heparin Unfractionated: 0.87 IU/mL — ABNORMAL HIGH (ref 0.30–0.70)

## 2019-03-29 LAB — C-REACTIVE PROTEIN: CRP: 1 mg/dL — ABNORMAL HIGH (ref <1.0)

## 2019-03-29 LAB — MAGNESIUM: Magnesium: 1.9 mg/dL (ref 1.7–2.4)

## 2019-03-29 LAB — GLUCOSE, CAPILLARY
Glucose-Capillary: 90 mg/dL (ref 70–99)
Glucose-Capillary: 96 mg/dL (ref 70–99)
Glucose-Capillary: 116 mg/dL — ABNORMAL HIGH (ref 70–99)
Glucose-Capillary: 101 mg/dL — ABNORMAL HIGH (ref 70–99)

## 2019-03-29 LAB — HEMOGLOBIN A1C
Hgb A1c MFr Bld: 5.7 % — ABNORMAL HIGH (ref 4.8–5.6)
Mean Plasma Glucose: 116.89 mg/dL

## 2019-03-29 MED ORDER — SODIUM CHLORIDE 0.9 % IV SOLN: 250.0000 mL | INTRAVENOUS | Status: DC | PRN

## 2019-03-29 MED ORDER — SODIUM CHLORIDE 0.9 % IV SOLN: 100.0000 mg | Freq: Every day | INTRAVENOUS | Status: DC

## 2019-03-29 MED ORDER — ACETAMINOPHEN 325 MG PO TABS
650.0000 mg | ORAL_TABLET | Freq: Four times a day (QID) | ORAL | Status: DC | PRN
  Administered 2019-03-29: 650 mg via ORAL
  Filled 2019-03-29: qty 2

## 2019-03-29 MED ORDER — ONDANSETRON HCL 4 MG PO TABS: 4.0000 mg | ORAL_TABLET | Freq: Four times a day (QID) | ORAL | Status: DC | PRN

## 2019-03-29 MED ORDER — IPRATROPIUM-ALBUTEROL 20-100 MCG/ACT IN AERS
1.0000 | INHALATION_SPRAY | Freq: Four times a day (QID) | RESPIRATORY_TRACT | Status: DC
  Administered 2019-03-29 (×3): 1 via RESPIRATORY_TRACT
  Filled 2019-03-29: qty 4

## 2019-03-29 MED ORDER — GUAIFENESIN-DM 100-10 MG/5ML PO SYRP: 10.0000 mL | ORAL_SOLUTION | ORAL | Status: DC | PRN

## 2019-03-29 MED ORDER — SENNOSIDES-DOCUSATE SODIUM 8.6-50 MG PO TABS: 1.0000 | ORAL_TABLET | Freq: Every evening | ORAL | Status: DC | PRN

## 2019-03-29 MED ORDER — INSULIN ASPART 100 UNIT/ML ~~LOC~~ SOLN
0.0000 [IU] | Freq: Three times a day (TID) | SUBCUTANEOUS | Status: DC
  Administered 2019-03-30 – 2019-04-03 (×3): 1 [IU] via SUBCUTANEOUS

## 2019-03-29 MED ORDER — CARBIDOPA-LEVODOPA 25-100 MG PO TABS
1.0000 | ORAL_TABLET | Freq: Three times a day (TID) | ORAL | Status: DC
  Administered 2019-03-29 – 2019-04-05 (×23): 1 via ORAL
  Filled 2019-03-29 (×23): qty 1

## 2019-03-29 MED ORDER — SODIUM CHLORIDE 0.9% FLUSH
3.0000 mL | Freq: Two times a day (BID) | INTRAVENOUS | Status: DC
  Administered 2019-03-29 – 2019-04-05 (×15): 3 mL via INTRAVENOUS

## 2019-03-29 MED ORDER — RASAGILINE MESYLATE 1 MG PO TABS
1.0000 mg | ORAL_TABLET | Freq: Every day | ORAL | Status: DC
  Administered 2019-03-29 – 2019-04-03 (×6): 1 mg via ORAL
  Filled 2019-03-29 (×8): qty 1

## 2019-03-29 MED ORDER — SODIUM CHLORIDE 0.9% FLUSH: 3.0000 mL | INTRAVENOUS | Status: DC | PRN

## 2019-03-29 MED ORDER — DEXAMETHASONE 6 MG PO TABS
6.0000 mg | ORAL_TABLET | Freq: Every day | ORAL | Status: DC
  Administered 2019-03-29 – 2019-04-05 (×8): 6 mg via ORAL
  Filled 2019-03-29 (×8): qty 1

## 2019-03-29 MED ORDER — ASCORBIC ACID 500 MG PO TABS
500.0000 mg | ORAL_TABLET | Freq: Every day | ORAL | Status: DC
  Administered 2019-03-29 – 2019-04-05 (×8): 500 mg via ORAL
  Filled 2019-03-29 (×8): qty 1

## 2019-03-29 MED ORDER — SIMETHICONE 80 MG PO CHEW
80.0000 mg | CHEWABLE_TABLET | Freq: Three times a day (TID) | ORAL | Status: DC
  Administered 2019-03-29 – 2019-04-05 (×22): 80 mg via ORAL
  Filled 2019-03-29 (×21): qty 1

## 2019-03-29 MED ORDER — OMEGA-3-ACID ETHYL ESTERS 1 G PO CAPS
1.0000 g | ORAL_CAPSULE | Freq: Every day | ORAL | Status: DC
  Administered 2019-03-29 – 2019-04-04 (×7): 1 g via ORAL
  Filled 2019-03-29 (×8): qty 1

## 2019-03-29 MED ORDER — INSULIN ASPART 100 UNIT/ML ~~LOC~~ SOLN: 0.0000 [IU] | Freq: Every day | SUBCUTANEOUS | Status: DC

## 2019-03-29 MED ORDER — DONEPEZIL HCL 10 MG PO TABS
10.0000 mg | ORAL_TABLET | Freq: Every day | ORAL | Status: DC
  Administered 2019-03-29 – 2019-04-05 (×8): 10 mg via ORAL
  Filled 2019-03-29 (×8): qty 1

## 2019-03-29 MED ORDER — APIXABAN 5 MG PO TABS
5.0000 mg | ORAL_TABLET | Freq: Two times a day (BID) | ORAL | Status: DC
  Administered 2019-04-05: 08:00:00 5 mg via ORAL
  Filled 2019-03-29: qty 1

## 2019-03-29 MED ORDER — SODIUM CHLORIDE 0.9% FLUSH
3.0000 mL | Freq: Two times a day (BID) | INTRAVENOUS | Status: DC
  Administered 2019-03-29 – 2019-04-04 (×11): 3 mL via INTRAVENOUS

## 2019-03-29 MED ORDER — APIXABAN 5 MG PO TABS
10.0000 mg | ORAL_TABLET | Freq: Two times a day (BID) | ORAL | Status: AC
  Administered 2019-03-29 – 2019-04-04 (×14): 10 mg via ORAL
  Filled 2019-03-29 (×14): qty 2

## 2019-03-29 MED ORDER — ONDANSETRON HCL 4 MG/2ML IJ SOLN: 4.0000 mg | Freq: Four times a day (QID) | INTRAMUSCULAR | Status: DC | PRN

## 2019-03-29 MED ORDER — ZINC SULFATE 220 (50 ZN) MG PO CAPS
220.0000 mg | ORAL_CAPSULE | Freq: Every day | ORAL | Status: DC
  Administered 2019-03-29 – 2019-04-05 (×8): 220 mg via ORAL
  Filled 2019-03-29 (×8): qty 1

## 2019-03-29 MED ORDER — SODIUM CHLORIDE 0.9 % IV SOLN: 200.0000 mg | Freq: Once | INTRAVENOUS | Status: DC

## 2019-03-29 MED ORDER — ASPIRIN EC 81 MG PO TBEC
81.0000 mg | DELAYED_RELEASE_TABLET | Freq: Every day | ORAL | Status: DC
  Administered 2019-03-29 – 2019-04-01 (×4): 81 mg via ORAL
  Filled 2019-03-29 (×4): qty 1

## 2019-03-29 NOTE — Discharge Instructions (Addendum)
You have a new medication--- a blood thinner---- Eliquis--also called apixaban--this is 10 mg TWICE per day then decrease to 5 mg TWICE PER DAY    Dexamethasone---- 6 mg once a day with food---this is a steroid    Please have your kidney function checked this week---the home health nurses will do this   Please call the hospital if you have any issues or questions on medications today   Wear a mask at home  You are contagious for 21 days after diagnosis of Coronavirus---through February 16th--please wear a mask---have family members drop off food if possible     Information on my medicine - ELIQUIS (apixaban)  Why was Eliquis prescribed for you? Eliquis was prescribed to treat blood clots that may have been found in the veins of your legs (deep vein thrombosis) or in your lungs (pulmonary embolism) and to reduce the risk of them occurring again.  What do You need to know about Eliquis ? The starting dose is 10 mg (two 5 mg tablets) taken TWICE daily for the FIRST SEVEN (7) DAYS, then on (enter date)  04/04/2018  the dose is reduced to ONE 5 mg tablet taken TWICE daily.  Eliquis may be taken with or without food.   Try to take the dose about the same time in the morning and in the evening. If you have difficulty swallowing the tablet whole please discuss with your pharmacist how to take the medication safely.  Take Eliquis exactly as prescribed and DO NOT stop taking Eliquis without talking to the doctor who prescribed the medication.  Stopping may increase your risk of developing a new blood clot.  Refill your prescription before you run out.  After discharge, you should have regular check-up appointments with your healthcare provider that is prescribing your Eliquis.    What do you do if you miss a dose? If a dose of ELIQUIS is not taken at the scheduled time, take it as soon as possible on the same day and twice-daily administration should be resumed. The dose should not be  doubled to make up for a missed dose.  Important Safety Information A possible side effect of Eliquis is bleeding. You should call your healthcare provider right away if you experience any of the following: ? Bleeding from an injury or your nose that does not stop. ? Unusual colored urine (red or dark Naim Murtha) or unusual colored stools (red or black). ? Unusual bruising for unknown reasons. ? A serious fall or if you hit your head (even if there is no bleeding).  Some medicines may interact with Eliquis and might increase your risk of bleeding or clotting while on Eliquis. To help avoid this, consult your healthcare provider or pharmacist prior to using any new prescription or non-prescription medications, including herbals, vitamins, non-steroidal anti-inflammatory drugs (NSAIDs) and supplements.  This website has more information on Eliquis (apixaban): http://www.eliquis.com/eliquis/home

## 2019-03-29 NOTE — Progress Notes (Signed)
ANTICOAGULATION CONSULT NOTE  Pharmacy Consult for Heparin Indication: pulmonary embolus  No Known Allergies  Patient Measurements: Height: 5' 10.5" (179.1 cm) Weight: 176 lb 9.4 oz (80.1 kg) IBW/kg (Calculated) : 74.15 Heparin Dosing Weight: 81.6 kg  Vital Signs: Temp: 98.8 F (37.1 C) (01/27 0004) Temp Source: Oral (01/27 0004) BP: 147/74 (01/26 2340) Pulse Rate: 76 (01/26 2340)  Labs: Recent Labs    03/28/19 1337 03/28/19 1537 03/29/19 0232  HGB 13.3  --  12.8*  HCT 42.5  --  38.5*  PLT 204  --  192  HEPARINUNFRC  --   --  0.87*  CREATININE 1.23  --   --   TROPONINIHS 13 12  --     Estimated Creatinine Clearance: 41.1 mL/min (by C-G formula based on SCr of 1.23 mg/dL).  Assessment: Patient is a 7 yom that presented to the ED with c/o CP. The patient was found to be COVID + and also positive for a PE. Pharmacy has been asked to dose heparin in this patient.   Heparin level supratherapeutic (0.87) - level drawn only ~5 hours post bolus and gtt start so may still be affected by bolus.  Goal of Therapy:  Heparin level 0.3-0.7 units/ml Monitor platelets by anticoagulation protocol: Yes   Plan:  Decrease heparin slightly to 1300 units/hr  Will f/u 8hr heparin level  Christoper Fabian, PharmD, BCPS Please see amion for complete clinical pharmacist phone list 03/29/2019,3:30 AM

## 2019-03-29 NOTE — Progress Notes (Signed)
Pt admitted to 5w07 from ED. Pt lives at home with wife. Pt is A&Ox3 with confusion and memory impairment. Pt's skin is warm, dry and intact. Pt placed on low bed with fall mats in place due to high fall risk. Instructed pt to call for assistance before getting out of bed. Showed pt how to use call bell and told to call for assistance. Pt verbalized understanding. Placed pt on telemetry monitor. Will continue to monitor pt. Nelda Marseille, RN

## 2019-03-29 NOTE — Progress Notes (Signed)
PROGRESS NOTE    Alexander Novak  CHY:[id number] DOB: Feb 16, 1926 DOA: 03/28/2019 PCP: Mila Palmer, MD   Brief Narrative:  86 year old with history of CAD, HTN, Parkinson's disease came to the ER with complaints of chest discomfort and cough.  Was diagnosed with COVID-19 pneumonia.  Chest x-ray showed mild interstitial prominence.  CTA showed small pulmonary embolism.   Assessment & Plan:   Principal Problem:   Pulmonary embolism (HCC) Active Problems:   CORONARY ATHEROSCLEROSIS NATIVE CORONARY ARTERY   Parkinson disease (HCC)   COVID-19 virus detected  Acute pulmonary embolism without cor pulmonale COVID-19 pneumonia/infection -Currently patient is on heparin drip, will plan to transition him to Eliquis. -Trend cardiac enzyme. -Oxygen levels-requiring 2 L nasal cannula with movement -Remdesivir-day 2 -Decadron-day 1 -Routine: Labs have been reviewed including ferritin, LDH, CRP, d-dimer, fibrinogen.  Will need to trend this lab daily. -Vitamin C & Zinc. Prone >16hrs/day.  -procalcitonin- negative.  -Chest x-ray and CTA chest-shows bilateral infiltrate with small pulmonary embolism -Supportive care-antitussive, inhalers, I-S/flutter -CODE STATUS confirmed -Insulin sliding scale Accu-Chek  Parkinson's disease -Continue home Sinemet  Coronary artery disease -Currently patient is chest pain-free.  No evidence of cardiac ischemia at this time.  Continue supportive care.  DVT prophylaxis: Heparin drip -> Eliquis Code Status: DNR Family Communication:  Sharlyn Bologna, Shawn Route- no answer. Called [facility] phne and some replied with foul language therefore hung up.  Disposition Plan: Given age, comorbidities, mild hypoxia, pulmonary embolism and moderate amount of infiltrate seen on the CTA chest he would benefit from at least next 24-48 hours of in-hospital stay to ensure he remained stable prior to discharge.  In the meantime we will transition him over to  Eliquis.   Subjective: Feels ok, no complaints.  Some confusion due to dementia and Parkinsons  Review of Systems Otherwise negative except as per HPI, including: General: Denies fever, chills, night sweats or unintended weight loss. Resp: Denies cough, wheezing, shortness of breath. Cardiac: Denies chest pain, palpitations, orthopnea, paroxysmal nocturnal dyspnea. GI: Denies abdominal pain, nausea, vomiting, diarrhea or constipation GU: Denies dysuria, frequency, hesitancy or incontinence MS: Denies muscle aches, joint pain or swelling Neuro: Denies headache, neurologic deficits (focal weakness, numbness, tingling), abnormal gait Psych: Denies anxiety, depression, SI/HI/AVH Skin: Denies new rashes or lesions ID: Denies sick contacts, exotic exposures, travel  Objective: Vitals:   03/28/19 2340 03/29/19 0004 03/29/19 0005 03/29/19 0533  BP: (!) 147/74   139/81  Pulse: 76   (!) 59  Resp: 18   (!) 21  Temp:  98.8 F (37.1 C)  (!) 97.3 F (36.3 C)  TempSrc:  Oral  Oral  SpO2: 97%   93%  Weight:   80.1 kg   Height:   5' 10.5" (1.791 m)     Intake/Output Summary (Last 24 hours) at 03/29/2019 0733 Last data filed at 03/29/2019 8786 Gross per 24 hour  Intake 403.89 ml  Output 550 ml  Net -146.11 ml   Filed Weights   03/28/19 1335 03/29/19 0005  Weight: 81.6 kg 80.1 kg    Examination:  General exam: Elderly frail, mitten in place.  Respiratory system: Clear to auscultation. Respiratory effort normal. Cardiovascular system: S1 & S2 heard, RRR. No JVD, murmurs, rubs, gallops or clicks. No pedal edema. Gastrointestinal system: Abdomen is nondistended, soft and nontender. No organomegaly or masses felt. Normal bowel sounds heard. Central nervous system: Alert and oriented to name only.  Extremities: Symmetric 4 x 5 power. Skin: No rashes, lesions or ulcers Psychiatry: Judgement  and insight appear Poor. Mood & affect appropriate.   Data Reviewed:   CBC: Recent Labs  Lab  03/28/19 1337 03/29/19 0232  WBC 8.8 8.9  NEUTROABS  --  5.2  HGB 13.3 12.8*  HCT 42.5 38.5*  MCV 96.2 92.5  PLT 204 192   Basic Metabolic Panel: Recent Labs  Lab 03/28/19 1337 03/29/19 0232  NA 137 137  K 4.3 4.3  CL 105 106  CO2 25 23  GLUCOSE 114* 101*  BUN 13 9  CREATININE 1.23 1.07  CALCIUM 8.7* 8.4*  MG  --  1.9   GFR: Estimated Creatinine Clearance: 47.2 mL/min (by C-G formula based on SCr of 1.07 mg/dL). Liver Function Tests: Recent Labs  Lab 03/29/19 0232  AST 28  ALT 20  ALKPHOS 79  BILITOT 0.7  PROT 6.5  ALBUMIN 2.6*   No results for input(s): LIPASE, AMYLASE in the last 168 hours. No results for input(s): AMMONIA in the last 168 hours. Coagulation Profile: No results for input(s): INR, PROTIME in the last 168 hours. Cardiac Enzymes: No results for input(s): CKTOTAL, CKMB, CKMBINDEX, TROPONINI in the last 168 hours. BNP (last 3 results) No results for input(s): PROBNP in the last 8760 hours. HbA1C: No results for input(s): HGBA1C in the last 72 hours. CBG: No results for input(s): GLUCAP in the last 168 hours. Lipid Profile: Recent Labs    03/28/19 2139  TRIG 62   Thyroid Function Tests: No results for input(s): TSH, T4TOTAL, FREET4, T3FREE, THYROIDAB in the last 72 hours. Anemia Panel: Recent Labs    03/28/19 2139 03/29/19 0232  FERRITIN 155 177   Sepsis Labs: Recent Labs  Lab 03/28/19 2139  PROCALCITON <0.10    Recent Results (from the past 240 hour(s))  Respiratory Panel by RT PCR (Flu A&B, Covid) - Nasopharyngeal Swab     Status: Abnormal   Collection Time: 03/28/19  3:37 PM   Specimen: Nasopharyngeal Swab  Result Value Ref Range Status   SARS Coronavirus 2 by RT PCR POSITIVE (A) NEGATIVE Final    Comment: RESULT CALLED TO, READ BACK BY AND VERIFIED WITH: Therese Sarah RN 17:00 03/28/19 ([email or web address]) (NOTE) SARS-CoV-2 target nucleic acids are DETECTED. SARS-CoV-2 RNA is generally detectable in upper respiratory specimens  during  the acute phase of infection. Positive results are indicative of the presence of the identified virus, but do not rule out bacterial infection or co-infection with other pathogens not detected by the test. Clinical correlation with patient history and other diagnostic information is necessary to determine patient infection status. The expected result is Negative. Fact Sheet for Patients:  https://www.moore.com/ Fact Sheet for Healthcare Providers: https://www.young.biz/ This test is not yet approved or cleared by the [place] FDA and  has been authorized for detection and/or diagnosis of SARS-CoV-2 by FDA under an Emergency Use Authorization (EUA).  This EUA will remain in effect (meaning this test can be used) fo r the duration of  the COVID-19 declaration under Section 564(b)(1) of the Act, 21 U.S.C. section 360bbb-3(b)(1), unless the authorization is terminated or revoked sooner.    Influenza A by PCR NEGATIVE NEGATIVE Final   Influenza B by PCR NEGATIVE NEGATIVE Final    Comment: (NOTE) The Xpert Xpress SARS-CoV-2/FLU/RSV assay is intended as an aid in  the diagnosis of influenza from Nasopharyngeal swab specimens and  should not be used as a sole basis for treatment. Nasal washings and  aspirates are unacceptable for Xpert Xpress SARS-CoV-2/FLU/RSV  testing. Fact Sheet for Patients: https://www.moore.com/  Fact Sheet for Healthcare Providers: https://www.young.biz/ This test is not yet approved or cleared by the [place] FDA and  has been authorized for detection and/or diagnosis of SARS-CoV-2 by  FDA under an Emergency Use Authorization (EUA). This EUA will remain  in effect (meaning this test can be used) for the duration of the  Covid-19 declaration under Section 564(b)(1) of the Act, 21  U.S.C. section 360bbb-3(b)(1), unless the authorization is  terminated or revoked. Performed at [facility], [address] [place], [place] [address]          Radiology Studies: DG Chest 2 View  Result Date: 03/28/2019 CLINICAL DATA:  Chest pain EXAM: CHEST - 2 VIEW COMPARISON:  2014 FINDINGS: Increased chronic interstitial prominence. No pleural effusion or pneumothorax. Possible pleural calcification at the left lung base. Cardiomediastinal contours are within normal limits with normal heart size. There is mild calcified plaque along the thoracic aorta. Surgical clips overlie the upper abdomen. No acute osseous abnormality. IMPRESSION: Increased interstitial prominence, which may reflect interstitial lung disease. Electronically Signed   By: Guadlupe Spanish M.D.   On: 03/28/2019 14:10   CT Angio Chest PE W/Cm &/Or Wo Cm  Result Date: 03/28/2019 CLINICAL DATA:  Intermittent chest pain and elevated D-dimer. EXAM: CT ANGIOGRAPHY CHEST WITH CONTRAST TECHNIQUE: Multidetector CT imaging of the chest was performed using the standard protocol during bolus administration of intravenous contrast. Multiplanar CT image reconstructions and MIPs were obtained to evaluate the vascular anatomy. CONTRAST:  61mL OMNIPAQUE IOHEXOL 350 MG/ML SOLN COMPARISON:  None. FINDINGS: Cardiovascular: There is moderate to marked severity calcification of the thoracic aorta. A very small amount of intraluminal low attenuation is seen within and upper lobe branch of the left pulmonary artery (axial CT image 43, CT series number 5/sagittal reformatted image 127, CT series number 9). Normal heart size. No pericardial effusion. Marked severity coronary artery calcification is seen. Mediastinum/Nodes: No enlarged mediastinal, hilar, or axillary lymph nodes. Thyroid gland, trachea, and esophagus demonstrate no significant findings. Lungs/Pleura: Mild emphysematous lung disease is seen involving the bilateral upper lobes. Mild to moderate severity patchy areas of atelectasis and/or infiltrate are seen along the periphery of both  lungs. There is no evidence of a pleural effusion or pneumothorax. Upper Abdomen: Numerous subcentimeter gallstones are seen within the lumen of an otherwise normal-appearing gallbladder. Musculoskeletal: Multilevel degenerative changes are seen throughout the thoracic spine Review of the MIP images confirms the above findings. IMPRESSION: 1. Very small amount of pulmonary embolism seen involving an upper lobe branch of the left pulmonary artery. 2. Mild to moderate severity patchy bilateral atelectasis and/or infiltrates. 3. Cholelithiasis. Electronically Signed   By: Aram Candela M.D.   On: 03/28/2019 21:29        Scheduled Meds: . aspirin EC  81 mg Oral Daily  . carbidopa-levodopa  1 tablet Oral TID AC  . donepezil  10 mg Oral Daily  . omega-3 acid ethyl esters  1 g Oral Daily  . rasagiline  1 mg Oral Daily  . simethicone  80 mg Oral TID PC  . sodium chloride flush  3 mL Intravenous Q12H  . sodium chloride flush  3 mL Intravenous Q12H   Continuous Infusions: . sodium chloride    . heparin 1,300 Units/hr (03/29/19 0339)  . remdesivir 100 mg in NS 100 mL       LOS: 0 days   Time spent= 35 mins    Reign Bartnick Joline Maxcy, MD [facility]  If  7PM-7AM, please contact night-coverage  03/29/2019, 7:33 AM

## 2019-03-29 NOTE — Progress Notes (Signed)
ANTICOAGULATION CONSULT NOTE - Initial Consult  Pharmacy Consult for Eliquis Indication: pulmonary embolus  No Known Allergies  Patient Measurements: Height: 5' 10.5" (179.1 cm) Weight: 176 lb 9.4 oz (80.1 kg) IBW/kg (Calculated) : 74.15  Vital Signs: Temp: 97.3 F (36.3 C) (01/27 0533) Temp Source: Oral (01/27 0533) BP: 139/81 (01/27 0533) Pulse Rate: 59 (01/27 0533)  Labs: Recent Labs    03/28/19 1337 03/28/19 1537 03/29/19 0232  HGB 13.3  --  12.8*  HCT 42.5  --  38.5*  PLT 204  --  192  HEPARINUNFRC  --   --  0.87*  CREATININE 1.23  --  1.07  TROPONINIHS 13 12  --     Estimated Creatinine Clearance: 47.2 mL/min (by C-G formula based on SCr of 1.07 mg/dL).   Medical History: Past Medical History:  Diagnosis Date  . Abdominal aortic aneurysm (HCC)    repaired by Dr. Madilyn Fireman  . CAD (coronary artery disease)    Acute inferior infarct in 1992. PTCA of her current symptoms in 1997 with a distal occluded AV circumflex. He had a proximal 95% stenosis that was treated with PCI)  . Dementia (HCC)   . HTN (hypertension)   . Hyperlipidemia   . Parkinson disease (HCC)   . PVD (peripheral vascular disease) (HCC)    Bilateral femoral artery occlusion managed medically    Assessment: 86yo male started on UFH for PE, now to transition to DOAC.   Plan:  At 10a will stop heparin and start Eliquis 10mg  PO BID x7d followed by 5mg  PO BID.  , PharmD, BCPS  03/29/2019,7:46 AM

## 2019-03-29 NOTE — Progress Notes (Signed)
PT Cancellation Note  Patient Details Name: Alexander Novak MRN: [id number] DOB: [birth year]   Cancelled Treatment:    Reason Eval/Treat Not Completed: Patient not medically ready  Noted patient positive for PE and anticoagulation initiated 1/26 at 22:49. Will hold activity until patient has been on anticoagulants x 24 hrs per best practice guidelines.    Jerolyn Center, PT Pager [phone]  Zena Amos 03/29/2019, 10:19 AM

## 2019-03-30 LAB — GLUCOSE, CAPILLARY
Glucose-Capillary: 103 mg/dL — ABNORMAL HIGH (ref 70–99)
Glucose-Capillary: 121 mg/dL — ABNORMAL HIGH (ref 70–99)
Glucose-Capillary: 96 mg/dL (ref 70–99)
Glucose-Capillary: 93 mg/dL (ref 70–99)

## 2019-03-30 LAB — CBC WITH DIFFERENTIAL/PLATELET
Abs Immature Granulocytes: 0.04 10*3/uL (ref 0.00–0.07)
Basophils Absolute: 0 10*3/uL (ref 0.0–0.1)
Basophils Relative: 0 %
Eosinophils Absolute: 0 10*3/uL (ref 0.0–0.5)
Eosinophils Relative: 0 %
HCT: 42.1 % (ref 39.0–52.0)
Hemoglobin: 13.9 g/dL (ref 13.0–17.0)
Immature Granulocytes: 0 %
Lymphocytes Relative: 11 %
Lymphs Abs: 1.1 10*3/uL (ref 0.7–4.0)
MCH: 30.2 pg (ref 26.0–34.0)
MCHC: 33 g/dL (ref 30.0–36.0)
MCV: 91.3 fL (ref 80.0–100.0)
Monocytes Absolute: 0.7 10*3/uL (ref 0.1–1.0)
Monocytes Relative: 7 %
Neutro Abs: 8.5 10*3/uL — ABNORMAL HIGH (ref 1.7–7.7)
Neutrophils Relative %: 82 %
Platelets: 238 10*3/uL (ref 150–400)
RBC: 4.61 MIL/uL (ref 4.22–5.81)
RDW: 13.6 % (ref 11.5–15.5)
WBC: 10.4 10*3/uL (ref 4.0–10.5)
nRBC: 0 % (ref 0.0–0.2)

## 2019-03-30 LAB — COMPREHENSIVE METABOLIC PANEL
ALT: 15 U/L (ref 0–44)
AST: 63 U/L — ABNORMAL HIGH (ref 15–41)
Albumin: 2.9 g/dL — ABNORMAL LOW (ref 3.5–5.0)
Alkaline Phosphatase: 82 U/L (ref 38–126)
Anion gap: 12 (ref 5–15)
BUN: 16 mg/dL (ref 8–23)
CO2: 21 mmol/L — ABNORMAL LOW (ref 22–32)
Calcium: 8.9 mg/dL (ref 8.9–10.3)
Chloride: 105 mmol/L (ref 98–111)
Creatinine, Ser: 1.18 mg/dL (ref 0.61–1.24)
GFR calc Af Amer: >60 mL/min (ref >60)
GFR calc non Af Amer: 54 mL/min — ABNORMAL LOW (ref >60)
Glucose, Bld: 105 mg/dL — ABNORMAL HIGH (ref 70–99)
Potassium: 4.5 mmol/L (ref 3.5–5.1)
Sodium: 138 mmol/L (ref 135–145)
Total Bilirubin: 0.5 mg/dL (ref 0.3–1.2)
Total Protein: 7.4 g/dL (ref 6.5–8.1)

## 2019-03-30 LAB — FERRITIN: Ferritin: 183 ng/mL (ref 24–336)

## 2019-03-30 LAB — LACTATE DEHYDROGENASE: LDH: 268 U/L — ABNORMAL HIGH (ref 98–192)

## 2019-03-30 LAB — D-DIMER, QUANTITATIVE: D-Dimer, Quant: 6.58 ug/mL-FEU — ABNORMAL HIGH (ref 0.00–0.50)

## 2019-03-30 LAB — C-REACTIVE PROTEIN: CRP: 0.9 mg/dL (ref <1.0)

## 2019-03-30 MED ORDER — QUETIAPINE FUMARATE 25 MG PO TABS
12.5000 mg | ORAL_TABLET | Freq: Two times a day (BID) | ORAL | Status: DC
  Administered 2019-03-30 – 2019-04-01 (×4): 12.5 mg via ORAL
  Filled 2019-03-30 (×5): qty 1

## 2019-03-30 MED ORDER — QUETIAPINE FUMARATE 25 MG PO TABS: 25.0000 mg | ORAL_TABLET | Freq: Every day | ORAL | Status: DC

## 2019-03-30 MED ORDER — VITAMIN D 25 MCG (1000 UNIT) PO TABS
1000.0000 [IU] | ORAL_TABLET | Freq: Every day | ORAL | Status: DC
  Administered 2019-03-30 – 2019-04-04 (×6): 1000 [IU] via ORAL
  Filled 2019-03-30 (×7): qty 1

## 2019-03-30 MED ORDER — QUETIAPINE FUMARATE 25 MG PO TABS: 12.5000 mg | ORAL_TABLET | Freq: Two times a day (BID) | ORAL | Status: DC

## 2019-03-30 MED ORDER — QUETIAPINE FUMARATE 25 MG PO TABS: 25.0000 mg | ORAL_TABLET | Freq: Two times a day (BID) | ORAL | Status: DC

## 2019-03-30 MED ORDER — LORAZEPAM 2 MG/ML IJ SOLN
1.0000 mg | Freq: Once | INTRAMUSCULAR | Status: DC
  Administered 2019-03-30: 1 mg via INTRAVENOUS
  Filled 2019-03-30: qty 1

## 2019-03-30 MED ORDER — IPRATROPIUM-ALBUTEROL 20-100 MCG/ACT IN AERS
1.0000 | INHALATION_SPRAY | Freq: Three times a day (TID) | RESPIRATORY_TRACT | Status: DC
  Administered 2019-03-30 – 2019-04-03 (×12): 1 via RESPIRATORY_TRACT
  Filled 2019-03-30: qty 4

## 2019-03-30 NOTE — Progress Notes (Signed)
Pt has been agitated throughout shift. Pt continues to try getting out of bed without assistance making bed alarm sound. High fall risk precautions in place. Pt is on low bed with floor mats in place. Nursing Staff has sit in room with pt throughout the night for safety measures. Will continue to monitor pt. Nelda Marseille, RN

## 2019-03-30 NOTE — Progress Notes (Signed)
PROGRESS NOTE  Alexander Novak PPI:[id number] DOB: 1926/01/30 DOA: 03/28/2019 PCP: Jonathon Jordan, MD  HPI/Recap of past 62 hours: 86 year old with history of CAD, HTN, Parkinson's disease came to the ER with complaints of chest discomfort and cough.  Was diagnosed with COVID-19 pneumonia.  Chest x-ray showed mild interstitial prominence.  CTA showed small pulmonary embolism.  03/30/19: Seen and examined.  Very confused this morning.  On fall precautions.  He has no new complaints.  Assessment/Plan: Principal Problem:   Pulmonary embolism (HCC) Active Problems:   CORONARY ATHEROSCLEROSIS NATIVE CORONARY ARTERY   Parkinson disease ([place])   COVID-19 virus detected  Acute pulmonary embolism without cor pulmonale COVID-19 pneumonia/infection -Presented with pulmonary embolism -Started on Eliquis on 03/29/2019. O2 saturation 94% on room air. -Oxygen levels-requiring 2 L nasal cannula with movement -Remdesivir-day 3/5 -p.o. Decadron 6 mg daily -day 2/10 Continue bronchodilators combivent 1 puff 3 times daily. Continue vitamin C, D3 and zinc.   Procalcitonin negative Continue to trend inflammatory markers. -Chest x-ray and CTA chest-shows bilateral infiltrate with small pulmonary embolism  Acute metabolic encephalopathy, possibly delirium Fall precautions in place Reorient as needed  Parkinson's disease -Continue home Sinemet  Coronary artery disease -Denies any anginal symptoms. -Continue aspirin  DVT prophylaxis: Eliquis Code Status: DNR Family Communication:   Will call family.   Disposition Plan: Patient is from home.  Plan is to discharge to home.  Barrier to discharge: Ongoing COVID-19 directed therapies and acute metabolic encephalopathy.   Objective: Vitals:   03/29/19 2216 03/30/19 0529 03/30/19 0750 03/30/19 0751  BP:  121/60  115/88  Pulse:  73 71 75  Resp:  (!) 21 18   Temp:  98.2 F (36.8 C)  97.6 F (36.4 C)  TempSrc:  Oral  Oral  SpO2: 95% 94%  93%   Weight:      Height:        Intake/Output Summary (Last 24 hours) at 03/30/2019 1350 Last data filed at 03/29/2019 1800 Gross per 24 hour  Intake 220 ml  Output --  Net 220 ml   Filed Weights   03/28/19 1335 03/29/19 0005  Weight: 81.6 kg 80.1 kg    Exam:  . General: 86 y.o. year-old male well developed well nourished in no acute distress.  Alert and confused. . Cardiovascular: Regular rate and rhythm with no rubs or gallops.  No thyromegaly or JVD noted.   Marland Kitchen Respiratory: Rales noted bilaterally with no wheezing noted.  Poor inspiratory effort.  .  Abdomen: Soft nontender nondistended with normal bowel sounds x4 quadrants. . Musculoskeletal: No lower extremity edema. 2/4 pulses in all 4 extremities. Marland Kitchen Psychiatry: Mood is appropriate for condition and setting   Data Reviewed: CBC: Recent Labs  Lab 03/28/19 1337 03/29/19 0232 03/30/19 0252  WBC 8.8 8.9 10.4  NEUTROABS  --  5.2 8.5*  HGB 13.3 12.8* 13.9  HCT 42.5 38.5* 42.1  MCV 96.2 92.5 91.3  PLT 204 192 063   Basic Metabolic Panel: Recent Labs  Lab 03/28/19 1337 03/29/19 0232 03/30/19 0252  NA 137 137 138  K 4.3 4.3 4.5  CL 105 106 105  CO2 25 23 21*  GLUCOSE 114* 101* 105*  BUN 13 9 16   CREATININE 1.23 1.07 1.18  CALCIUM 8.7* 8.4* 8.9  MG  --  1.9  --    GFR: Estimated Creatinine Clearance: 42.8 mL/min (by C-G formula based on SCr of 1.18 mg/dL). Liver Function Tests: Recent Labs  Lab 03/29/19 0232 03/30/19 0252  AST 28 63*  ALT 20 15  ALKPHOS 79 82  BILITOT 0.7 0.5  PROT 6.5 7.4  ALBUMIN 2.6* 2.9*   No results for input(s): LIPASE, AMYLASE in the last 168 hours. No results for input(s): AMMONIA in the last 168 hours. Coagulation Profile: No results for input(s): INR, PROTIME in the last 168 hours. Cardiac Enzymes: No results for input(s): CKTOTAL, CKMB, CKMBINDEX, TROPONINI in the last 168 hours. BNP (last 3 results) No results for input(s): PROBNP in the last 8760  hours. HbA1C: Recent Labs    03/29/19 0948  HGBA1C 5.7*   CBG: Recent Labs  Lab 03/29/19 1159 03/29/19 1609 03/29/19 2105 03/30/19 0822 03/30/19 1215  GLUCAP 96 116* 101* 103* 121*   Lipid Profile: Recent Labs    03/28/19 2139  TRIG 62   Thyroid Function Tests: No results for input(s): TSH, T4TOTAL, FREET4, T3FREE, THYROIDAB in the last 72 hours. Anemia Panel: Recent Labs    03/29/19 0232 03/30/19 0252  FERRITIN 177 183   Urine analysis: No results found for: COLORURINE, APPEARANCEUR, LABSPEC, PHURINE, GLUCOSEU, HGBUR, BILIRUBINUR, KETONESUR, PROTEINUR, UROBILINOGEN, NITRITE, LEUKOCYTESUR Sepsis Labs: @LABRCNTIP (procalcitonin:4,lacticidven:4)  ) Recent Results (from the past 240 hour(s))  Respiratory Panel by RT PCR (Flu A&B, Covid) - Nasopharyngeal Swab     Status: Abnormal   Collection Time: 03/28/19  3:37 PM   Specimen: Nasopharyngeal Swab  Result Value Ref Range Status   SARS Coronavirus 2 by RT PCR POSITIVE (A) NEGATIVE Final    Comment: RESULT CALLED TO, READ BACK BY AND VERIFIED WITH: 03/30/19 RN 17:00 03/28/19 ([email or web address]) (NOTE) SARS-CoV-2 target nucleic acids are DETECTED. SARS-CoV-2 RNA is generally detectable in upper respiratory specimens  during the acute phase of infection. Positive results are indicative of the presence of the identified virus, but do not rule out bacterial infection or co-infection with other pathogens not detected by the test. Clinical correlation with patient history and other diagnostic information is necessary to determine patient infection status. The expected result is Negative. Fact Sheet for Patients:  03/30/19 Fact Sheet for Healthcare Providers: https://www.moore.com/ This test is not yet approved or cleared by the https://www.young.biz/ FDA and  has been authorized for detection and/or diagnosis of SARS-CoV-2 by FDA under an Emergency Use Authorization (EUA).  This EUA  will remain in effect (meaning this test can be used) fo r the duration of  the COVID-19 declaration under Section 564(b)(1) of the Act, 21 U.S.C. section 360bbb-3(b)(1), unless the authorization is terminated or revoked sooner.    Influenza A by PCR NEGATIVE NEGATIVE Final   Influenza B by PCR NEGATIVE NEGATIVE Final    Comment: (NOTE) The Xpert Xpress SARS-CoV-2/FLU/RSV assay is intended as an aid in  the diagnosis of influenza from Nasopharyngeal swab specimens and  should not be used as a sole basis for treatment. Nasal washings and  aspirates are unacceptable for Xpert Xpress SARS-CoV-2/FLU/RSV  testing. Fact Sheet for Patients: [place] Fact Sheet for Healthcare Providers: https://www.moore.com/ This test is not yet approved or cleared by the https://www.young.biz/ FDA and  has been authorized for detection and/or diagnosis of SARS-CoV-2 by  FDA under an Emergency Use Authorization (EUA). This EUA will remain  in effect (meaning this test can be used) for the duration of the  Covid-19 declaration under Section 564(b)(1) of the Act, 21  U.S.C. section 360bbb-3(b)(1), unless the authorization is  terminated or revoked. Performed at [facility], [address], [place], [place]  Studies: No results found.  Scheduled Meds: . apixaban  10 mg Oral BID   Followed by  . [START ON 04/05/2019] apixaban  5 mg Oral BID  . vitamin C  500 mg Oral Daily  . aspirin EC  81 mg Oral Daily  . carbidopa-levodopa  1 tablet Oral TID AC  . dexamethasone  6 mg Oral Daily  . donepezil  10 mg Oral Daily  . insulin aspart  0-5 Units Subcutaneous QHS  . insulin aspart  0-9 Units Subcutaneous TID WC  . Ipratropium-Albuterol  1 puff Inhalation TID  . omega-3 acid ethyl esters  1 g Oral Daily  . rasagiline  1 mg Oral Daily  . simethicone  80 mg Oral TID PC  . sodium chloride flush  3 mL Intravenous Q12H  . sodium chloride flush  3  mL Intravenous Q12H  . zinc sulfate  220 mg Oral Daily    Continuous Infusions: . sodium chloride    . remdesivir 100 mg in NS 100 mL 100 mg (03/30/19 1000)     LOS: 1 day     Darlin Drop, MD [facility] Pager [phone]  If 7PM-7AM, please contact night-coverage [email or web address] Password [email or web address] 03/30/2019, 1:50 PM

## 2019-03-30 NOTE — Progress Notes (Signed)
Updated the patient's spouse Sallye Ober via phone.  All questions answered.

## 2019-03-30 NOTE — Evaluation (Addendum)
Occupational Therapy Evaluation Patient Details Name: Alexander Novak MRN: [id number] DOB: Nov 21, 1925 Today's Date: 03/30/2019    History of Present Illness 86 y.o. male with medical history significant for CAD, hypertension, Parkinson's disease, and memory loss, now presenting to the emergency department with 3 days of chest pain and cough. D-dimer elevated to 6.64, and Covid PCR is positive.  CTA chest is notable for small PE.     Clinical Impression   PTA, pt was living with his wife and performed BADLs, light IADLs, and medication management; information collected by PT who called wife. Pt currently performing ADLs at Supervision-Min guard A level. Pt with decreased cognition and requiring increased cues and times. SpO2 90s on RA throughout. Recommend dc to home once medically stable per physician. All acute OT needs met and will sign off.     Follow Up Recommendations  No OT follow up;Supervision/Assistance - 24 hour    Equipment Recommendations  None recommended by OT    Recommendations for Other Services PT consult     Precautions / Restrictions Precautions Precautions: Fall Restrictions Weight Bearing Restrictions: No      Mobility Bed Mobility Overal bed mobility: Needs Assistance Bed Mobility: Supine to Sit     Supine to sit: Mod assist;+2 for physical assistance;HOB elevated     General bed mobility comments: assist to guide pt to sit up due to confusion, not due to lack of strength  Transfers Overall transfer level: Needs assistance Equipment used: None Transfers: Sit to/from Stand Sit to Stand: Min guard         General transfer comment: for safety due to lines/tubes    Balance Overall balance assessment: No apparent balance deficits (not formally assessed)                                         ADL either performed or assessed with clinical judgement   ADL Overall ADL's : Needs assistance/impaired Eating/Feeding: Set  up;Sitting   Grooming: Wash/dry hands;Min guard;Standing   Upper Body Bathing: Set up;Supervision/ safety;Sitting   Lower Body Bathing: Min guard;Sit to/from stand   Upper Body Dressing : Set up;Supervision/safety;Sitting   Lower Body Dressing: Min guard;Sit to/from stand   Toilet Transfer: Min guard;Ambulation;Minimal assistance           Functional mobility during ADLs: Min guard;Minimal assistance       Vision Baseline Vision/History: Wears glasses Patient Visual Report: No change from baseline       Perception     Praxis      Pertinent Vitals/Pain Pain Assessment: No/denies pain     Hand Dominance Right   Extremity/Trunk Assessment Upper Extremity Assessment Upper Extremity Assessment: Overall WFL for tasks assessed   Lower Extremity Assessment Lower Extremity Assessment: Defer to PT evaluation   Cervical / Trunk Assessment Cervical / Trunk Assessment: Kyphotic   Communication Communication Communication: HOH;Expressive difficulties(speech unintelligible at times)   Cognition Arousal/Alertness: Awake/alert Behavior During Therapy: Restless Overall Cognitive Status: No family/caregiver present to determine baseline cognitive functioning                                 General Comments: wife reports he was confused when he came to the ED; has history of decr memory   General Comments  VSS on room air    Exercises  Shoulder Instructions      Home Living Family/patient expects to be discharged to:: Private residence Living Arrangements: Spouse/significant other Available Help at Discharge: Family Type of Home: House Home Access: Level entry     Home Layout: One level     Bathroom Shower/Tub: Chief Strategy Officer: Grab bars - tub/shower   Additional Comments: information obtained from wife Alexander Novak) by phone      Prior Functioning/Environment Level of Independence: Needs assistance  Gait / Transfers  Assistance Needed: walks independently with no h/o falls ADL's / Homemaking Assistance Needed: general supervision for tasks (although he has not allowed her  to [place] his medicines--she was instructed she needs to do this when he comes home by ED staff); he has even been able to go into grocery store to get up to 4 items (she drives) Communication / Swallowing Assistance Needed: very HOH, speech unintelligible at times Comments: wife states she is also quarantining, but has no symptoms and has not been tested. She described several ways she has set up home and lined up assistance to bring items to leave on their porch.         OT Problem List: Decreased activity tolerance;Decreased strength;Decreased knowledge of use of DME or AE;Decreased knowledge of precautions;Decreased safety awareness;Decreased cognition      OT Treatment/Interventions:      OT Goals(Current goals can be found in the care plan section) Acute Rehab OT Goals Patient Stated Goal: Unstated OT Goal Formulation: Patient unable to participate in goal setting  OT Frequency:     Barriers to D/C:            Co-evaluation PT/OT/SLP Co-Evaluation/Treatment: Yes Reason for Co-Treatment: Necessary to address cognition/behavior during functional activity   OT goals addressed during session: ADL's and self-care      AM-PAC OT "6 Clicks" Daily Activity     Outcome Measure Help from another person eating meals?: None Help from another person taking care of personal grooming?: A Little Help from another person toileting, which includes using toliet, bedpan, or urinal?: A Little Help from another person bathing (including washing, rinsing, drying)?: A Little Help from another person to put on and taking off regular upper body clothing?: None Help from another person to put on and taking off regular lower body clothing?: A Little 6 Click Score: 20   End of Session Nurse Communication: Mobility status  Activity  Tolerance: Patient tolerated treatment well Patient left: in chair;with call bell/phone within reach;with chair alarm set  OT Visit Diagnosis: Other abnormalities of gait and mobility (R26.89);Muscle weakness (generalized) (M62.81)                Time: 4883-0141 OT Time Calculation (min): 20 min Charges:  OT General Charges $OT Visit: 1 Visit OT Evaluation $OT Eval Moderate Complexity: [place], OTR/L Acute Rehab Pager: [phone] Office: [place] 03/30/2019, 1:25 PM

## 2019-03-30 NOTE — Evaluation (Signed)
Physical Therapy Evaluation and Discharge Patient Details Name: Alexander Novak MRN: [id number] DOB: [birth year] Today's Date: 03/30/2019   History of Present Illness  86 y.o. male with medical history significant for CAD, hypertension, Parkinson's disease, and memory loss, now presenting to the emergency department with 3 days of chest pain and cough. D-dimer elevated to 6.64, and Covid PCR is positive.  CTA chest is notable for small PE.    Clinical Impression   Patient evaluated by Physical Therapy with no further acute PT needs identified. Patient moving with minguard assist due to lines/tubes and confusion. Spoke with patient's wife and he was very independent PTA (states he can out walk her; she would send him into the grocery store to get a few items if that's all they needed). She states she is already quarantining at home with no symptoms.  PT is signing off. Thank you for this referral.     Follow Up Recommendations No PT follow up    Equipment Recommendations  None recommended by PT    Recommendations for Other Services       Precautions / Restrictions Precautions Precautions: Fall      Mobility  Bed Mobility Overal bed mobility: Needs Assistance Bed Mobility: Supine to Sit     Supine to sit: Mod assist;+2 for physical assistance;HOB elevated     General bed mobility comments: assist to guide pt to sit up due to confusion, not due to lack of strength  Transfers Overall transfer level: Needs assistance Equipment used: None Transfers: Sit to/from Stand Sit to Stand: Min guard         General transfer comment: for safety due to lines/tubes  Ambulation/Gait Ambulation/Gait assistance: Min guard Gait Distance (Feet): 35 Feet Assistive device: None Gait Pattern/deviations: Step-through pattern;Decreased stride length     General Gait Details: slightly kyphotic; steady does not reach for UE support (occasional guidance via HHa due to confusion)  Stairs             Wheelchair Mobility    Modified Rankin (Stroke Patients Only)       Balance Overall balance assessment: No apparent balance deficits (not formally assessed)                                           Pertinent Vitals/Pain Pain Assessment: No/denies pain    Home Living Family/patient expects to be discharged to:: Private residence Living Arrangements: Spouse/significant other Available Help at Discharge: Family Type of Home: House Home Access: Level entry     Home Layout: One level Home Equipment: Grab bars - tub/shower Additional Comments: information obtained from wife Alexander Novak) by phone    Prior Function Level of Independence: Needs assistance   Gait / Transfers Assistance Needed: walks independently with no h/o falls  ADL's / Homemaking Assistance Needed: general supervision for tasks (although he has not allowed her  to [facility] his medicines--she was instructed she needs to do this when he comes home by ED staff); he has even been able to go into grocery store to get up to 4 items (she drives)  Comments: wife states she is also quarantining, but has no symptoms and has not been tested. She described several ways she has set up home and lined up assistance to bring items to leave on their porch.      Hand Dominance   Dominant Hand: Right  Extremity/Trunk Assessment   Upper Extremity Assessment Upper Extremity Assessment: Defer to OT evaluation    Lower Extremity Assessment Lower Extremity Assessment: Overall WFL for tasks assessed    Cervical / Trunk Assessment Cervical / Trunk Assessment: Kyphotic  Communication   Communication: HOH;Expressive difficulties(speech unintelligible at times)  Cognition Arousal/Alertness: Awake/alert Behavior During Therapy: Restless Overall Cognitive Status: No family/caregiver present to determine baseline cognitive functioning                                 General Comments:  wife reports he was confused when he came to the ED; has history of decr memory      General Comments General comments (skin integrity, edema, etc.): VSS on room air    Exercises     Assessment/Plan    PT Assessment Patent does not need any further PT services  PT Problem List         PT Treatment Interventions      PT Goals (Current goals can be found in the Care Plan section)  Acute Rehab PT Goals PT Goal Formulation: All assessment and education complete, DC therapy    Frequency     Barriers to discharge        Co-evaluation PT/OT/SLP Co-Evaluation/Treatment: Yes Reason for Co-Treatment: Necessary to address cognition/behavior during functional activity;For patient/therapist safety;To address functional/ADL transfers           AM-PAC PT "6 Clicks" Mobility  Outcome Measure Help needed turning from your back to your side while in a flat bed without using bedrails?: None Help needed moving from lying on your back to sitting on the side of a flat bed without using bedrails?: A Little Help needed moving to and from a bed to a chair (including a wheelchair)?: None Help needed standing up from a chair using your arms (e.g., wheelchair or bedside chair)?: None Help needed to walk in hospital room?: None Help needed climbing 3-5 steps with a railing? : None 6 Click Score: 23    End of Session   Activity Tolerance: Patient tolerated treatment well Patient left: in chair;with call bell/phone within reach;with chair alarm set Nurse Communication: Mobility status(no further PT needs) PT Visit Diagnosis: Other symptoms and signs involving the nervous system (R29.898)    Time: 2683-4196 PT Time Calculation (min) (ACUTE ONLY): 19 min   Charges:   PT Evaluation $PT Eval Low Complexity: 1 Low           Arby Barrette, PT Pager [phone]   Rexanne Mano 03/30/2019, 9:48 AM

## 2019-03-31 LAB — COMPREHENSIVE METABOLIC PANEL
ALT: 42 U/L (ref 0–44)
AST: 182 U/L — ABNORMAL HIGH (ref 15–41)
Albumin: 2.9 g/dL — ABNORMAL LOW (ref 3.5–5.0)
Alkaline Phosphatase: 94 U/L (ref 38–126)
Anion gap: 12 (ref 5–15)
BUN: 19 mg/dL (ref 8–23)
CO2: 18 mmol/L — ABNORMAL LOW (ref 22–32)
Calcium: 9 mg/dL (ref 8.9–10.3)
Chloride: 106 mmol/L (ref 98–111)
Creatinine, Ser: 1.13 mg/dL (ref 0.61–1.24)
GFR calc Af Amer: >60 mL/min (ref >60)
GFR calc non Af Amer: 57 mL/min — ABNORMAL LOW (ref >60)
Glucose, Bld: 90 mg/dL (ref 70–99)
Potassium: 4.5 mmol/L (ref 3.5–5.1)
Sodium: 136 mmol/L (ref 135–145)
Total Bilirubin: 1 mg/dL (ref 0.3–1.2)
Total Protein: 7.4 g/dL (ref 6.5–8.1)

## 2019-03-31 LAB — CBC WITH DIFFERENTIAL/PLATELET
Abs Immature Granulocytes: 0.1 10*3/uL — ABNORMAL HIGH (ref 0.00–0.07)
Basophils Absolute: 0 10*3/uL (ref 0.0–0.1)
Basophils Relative: 0 %
Eosinophils Absolute: 0 10*3/uL (ref 0.0–0.5)
Eosinophils Relative: 0 %
HCT: 46.8 % (ref 39.0–52.0)
Hemoglobin: 15.4 g/dL (ref 13.0–17.0)
Immature Granulocytes: 1 %
Lymphocytes Relative: 7 %
Lymphs Abs: 1.3 10*3/uL (ref 0.7–4.0)
MCH: 30 pg (ref 26.0–34.0)
MCHC: 32.9 g/dL (ref 30.0–36.0)
MCV: 91.1 fL (ref 80.0–100.0)
Monocytes Absolute: 1.4 10*3/uL — ABNORMAL HIGH (ref 0.1–1.0)
Monocytes Relative: 8 %
Neutro Abs: 15.1 10*3/uL — ABNORMAL HIGH (ref 1.7–7.7)
Neutrophils Relative %: 84 %
Platelets: 273 10*3/uL (ref 150–400)
RBC: 5.14 MIL/uL (ref 4.22–5.81)
RDW: 13.6 % (ref 11.5–15.5)
WBC: 18 10*3/uL — ABNORMAL HIGH (ref 4.0–10.5)
nRBC: 0 % (ref 0.0–0.2)

## 2019-03-31 LAB — D-DIMER, QUANTITATIVE: D-Dimer, Quant: 8.2 ug/mL-FEU — ABNORMAL HIGH (ref 0.00–0.50)

## 2019-03-31 LAB — GLUCOSE, CAPILLARY
Glucose-Capillary: 72 mg/dL (ref 70–99)
Glucose-Capillary: 114 mg/dL — ABNORMAL HIGH (ref 70–99)
Glucose-Capillary: 113 mg/dL — ABNORMAL HIGH (ref 70–99)
Glucose-Capillary: 125 mg/dL — ABNORMAL HIGH (ref 70–99)

## 2019-03-31 LAB — FERRITIN: Ferritin: 240 ng/mL (ref 24–336)

## 2019-03-31 LAB — C-REACTIVE PROTEIN: CRP: 0.9 mg/dL (ref <1.0)

## 2019-03-31 LAB — LACTATE DEHYDROGENASE: LDH: 611 U/L — ABNORMAL HIGH (ref 98–192)

## 2019-03-31 MED ORDER — FUROSEMIDE 10 MG/ML IJ SOLN
20.0000 mg | Freq: Two times a day (BID) | INTRAMUSCULAR | Status: DC
  Administered 2019-03-31 – 2019-04-01 (×3): 20 mg via INTRAVENOUS
  Filled 2019-03-31 (×3): qty 2

## 2019-03-31 NOTE — TOC Initial Note (Signed)
Transition of Care [facility]) - Initial/Assessment Note    Patient Details  Name: Alexander Novak MRN: [id number] Date of Birth: 1925-12-04  [facility] CM/SW Contact:    Cherylann Parr, RN Phone Number: 03/31/2019, 3:07 PM  Clinical Narrative:    PTA from home with wife.  CM was unable to reach pt via phone - CM was able to reach pts wife.  Wife will provide recommended supervision at discharge.  Pt has PCP and wife denied barriers with paying for discharge medications.  Pts will pick him up at discharge.  Wife in agreement with HH as ordered - CM provided medicare.gov HH list for choice - wife chose [facility] - agency accepts referral.                Expected Discharge Plan: Home w Home Health Services     Patient Goals and CMS Choice     Choice offered to / list presented to : Spouse  Expected Discharge Plan and Services Expected Discharge Plan: Home w Home Health Services       Living arrangements for the past 2 months: Single Family Home                           HH Arranged: RN, PT, OT [facility] Agency: [facility] Date [facility] Agency Contacted: 03/31/19 Time HH Agency Contacted: [phone] Representative spoke with at [facility] Agency: Cassie  Prior Living Arrangements/Services Living arrangements for the past 2 months: Single Family Home Lives with:: Spouse Patient language and need for interpreter reviewed:: Yes        Need for Family Participation in Patient Care: Yes (Comment) Care giver support system in place?: Yes (comment) Current home services: Home OT, Home PT, Home RN Criminal Activity/Legal Involvement Pertinent to Current Situation/Hospitalization: No - Comment as needed  Activities of Daily Living      Permission Sought/Granted   Permission granted to share information with : Yes, Verbal Permission Granted              Emotional Assessment       Orientation: : Fluctuating Orientation (Suspected and/or reported [place])   Psych  Involvement: No (comment)  Admission diagnosis:  Pulmonary embolism (HCC) [I26.99] Other acute pulmonary embolism without acute cor pulmonale (HCC) [I26.99] COVID-19 virus infection [U07.1] Patient Active Problem List   Diagnosis Date Noted  . Pulmonary embolism (HCC) 03/28/2019  . COVID-19 virus detected 03/28/2019  . Parkinson disease (HCC) 02/26/2016  . Dementia (HCC) 07/06/2013  . DYSLIPIDEMIA 03/25/2009  . ESSENTIAL HYPERTENSION, BENIGN 03/25/2009  . CORONARY ATHEROSCLEROSIS NATIVE CORONARY ARTERY 03/25/2009  . Abnormal involuntary movement 03/25/2009   PCP:  Mila Palmer, MD Pharmacy:   [facility] [phone] - [place], [place] - [address] AT [address] OF [address] [place] [address] Phone: [phone] Fax: [phone]  [facility] - [place], [place] - [address] [facility] [facility] [address] [place] [address] [place] [address] Phone: [phone] Fax: [phone]     Social Determinants of Health (SDOH) Interventions    Readmission Risk Interventions No flowsheet data found.

## 2019-03-31 NOTE — Progress Notes (Addendum)
PROGRESS NOTE  Alexander Novak PJS:[id number] DOB: [birth year] DOA: 03/28/2019 PCP: Mila Palmer, MD  HPI/Recap of past 45 hours: 86 year old with history of CAD, HTN, dementia, Parkinson's disease came to the ER with complaints of chest discomfort and cough.    In-house positive screening COVID-19 03/28/2019.  Chest x-ray bilateral pulmonary infiltrates.  Was diagnosed with COVID-19 pneumonia.  Chest x-ray showed mild interstitial prominence.  CTA showed small pulmonary embolism.  Hospital course complicated by delirium for which bilateral soft restraints were placed.  Started on Seroquel 12.5 mg twice daily.  03/31/19: Seen and examined.  On soft restraints.  Alert but confused.  Fall precautions in place.  He has no new complaints.    Assessment/Plan: Principal Problem:   Pulmonary embolism (HCC) Active Problems:   CORONARY ATHEROSCLEROSIS NATIVE CORONARY ARTERY   Parkinson disease (HCC)   COVID-19 virus detected  Acute pulmonary embolism without cor pulmonale COVID-19 pneumonia/infection -Presented with chest discomfort and cough was found to have a pulmonary embolism.  -Started on Eliquis on 03/29/2019. Currently on room air. -Remdesivir-day 4/5 -p.o. Decadron 6 mg daily -day 3/10 Continue bronchodilators combivent 1 puff 3 times daily. Continue vitamin C, D3 and zinc.   Procalcitonin negative Inflammatory markers are trending down  Acute metabolic encephalopathy, suspect delirium Fall precautions in place Reorient as needed Started Seroquel 12.5 mg twice daily Ordered twelve-lead EKG to assess QTC, pending.  Parkinson's disease -Continue home Sinemet  Coronary artery disease -Denies any anginal symptoms. -Continue aspirin  DVT prophylaxis: Eliquis Code Status: DNR Family Communication:   Updated his spouse via phone on 03/30/2019.   Disposition Plan: Patient is from home.  Plan is to discharge to home.  Barrier to discharge: Ongoing COVID-19 directed  therapies and acute metabolic encephalopathy.   Objective: Vitals:   03/31/19 0400 03/31/19 0436 03/31/19 0500 03/31/19 0600  BP:  (!) 133/119    Pulse: 80  79 86  Resp: (!) 22  20 (!) 21  Temp:  98.5 F (36.9 C)    TempSrc:  Axillary    SpO2: 91%  91% 94%  Weight:      Height:        Intake/Output Summary (Last 24 hours) at 03/31/2019 1339 Last data filed at 03/31/2019 0900 Gross per 24 hour  Intake 240 ml  Output 700 ml  Net -460 ml   Filed Weights   03/28/19 1335 03/29/19 0005  Weight: 81.6 kg 80.1 kg    Exam:  . General: 86 y.o. year-old male well-developed well-nourished no acute distress.  Alert and confused.   . Cardiovascular: Regular rate and rhythm no rubs or gallops.   Marland Kitchen Respiratory: Mild rales at bases no wheezing noted.   .   Abdomen: Soft nontender normal bowel sounds present.   . Musculoskeletal: No lower extremity edema bilaterally.   Marland Kitchen Psychiatry: Unable to assess mood due to confusion.   Data Reviewed: CBC: Recent Labs  Lab 03/28/19 1337 03/29/19 0232 03/30/19 0252 03/31/19 0734  WBC 8.8 8.9 10.4 18.0*  NEUTROABS  --  5.2 8.5* 15.1*  HGB 13.3 12.8* 13.9 15.4  HCT 42.5 38.5* 42.1 46.8  MCV 96.2 92.5 91.3 91.1  PLT 204 192 238 273   Basic Metabolic Panel: Recent Labs  Lab 03/28/19 1337 03/29/19 0232 03/30/19 0252 03/31/19 0734  NA 137 137 138 136  K 4.3 4.3 4.5 4.5  CL 105 106 105 106  CO2 25 23 21* 18*  GLUCOSE 114* 101* 105* 90  BUN 13 9  16 19  CREATININE 1.23 1.07 1.18 1.13  CALCIUM 8.7* 8.4* 8.9 9.0  MG  --  1.9  --   --    GFR: Estimated Creatinine Clearance: 44.7 mL/min (by C-G formula based on SCr of 1.13 mg/dL). Liver Function Tests: Recent Labs  Lab 03/29/19 0232 03/30/19 0252 03/31/19 0734  AST 28 63* 182*  ALT 20 15 42  ALKPHOS 79 82 94  BILITOT 0.7 0.5 1.0  PROT 6.5 7.4 7.4  ALBUMIN 2.6* 2.9* 2.9*   No results for input(s): LIPASE, AMYLASE in the last 168 hours. No results for input(s): AMMONIA in the  last 168 hours. Coagulation Profile: No results for input(s): INR, PROTIME in the last 168 hours. Cardiac Enzymes: No results for input(s): CKTOTAL, CKMB, CKMBINDEX, TROPONINI in the last 168 hours. BNP (last 3 results) No results for input(s): PROBNP in the last 8760 hours. HbA1C: Recent Labs    03/29/19 0948  HGBA1C 5.7*   CBG: Recent Labs  Lab 03/30/19 1215 03/30/19 1734 03/30/19 2136 03/31/19 0803 03/31/19 1237  GLUCAP 121* 96 93 72 114*   Lipid Profile: Recent Labs    03/28/19 2139  TRIG 62   Thyroid Function Tests: No results for input(s): TSH, T4TOTAL, FREET4, T3FREE, THYROIDAB in the last 72 hours. Anemia Panel: Recent Labs    03/30/19 0252 03/31/19 0734  FERRITIN 183 240   Urine analysis: No results found for: COLORURINE, APPEARANCEUR, LABSPEC, PHURINE, GLUCOSEU, HGBUR, BILIRUBINUR, KETONESUR, PROTEINUR, UROBILINOGEN, NITRITE, LEUKOCYTESUR Sepsis Labs: @LABRCNTIP (procalcitonin:4,lacticidven:4)  ) Recent Results (from the past 240 hour(s))  Respiratory Panel by RT PCR (Flu A&B, Covid) - Nasopharyngeal Swab     Status: Abnormal   Collection Time: 03/28/19  3:37 PM   Specimen: Nasopharyngeal Swab  Result Value Ref Range Status   SARS Coronavirus 2 by RT PCR POSITIVE (A) NEGATIVE Final    Comment: RESULT CALLED TO, READ BACK BY AND VERIFIED WITH: 03/30/19 RN 17:00 03/28/19 ([email or web address]) (NOTE) SARS-CoV-2 target nucleic acids are DETECTED. SARS-CoV-2 RNA is generally detectable in upper respiratory specimens  during the acute phase of infection. Positive results are indicative of the presence of the identified virus, but do not rule out bacterial infection or co-infection with other pathogens not detected by the test. Clinical correlation with patient history and other diagnostic information is necessary to determine patient infection status. The expected result is Negative. Fact Sheet for Patients:  03/30/19 Fact Sheet for  Healthcare Providers: https://www.moore.com/ This test is not yet approved or cleared by the https://www.young.biz/ FDA and  has been authorized for detection and/or diagnosis of SARS-CoV-2 by FDA under an Emergency Use Authorization (EUA).  This EUA will remain in effect (meaning this test can be used) fo r the duration of  the COVID-19 declaration under Section 564(b)(1) of the Act, 21 U.S.C. section 360bbb-3(b)(1), unless the authorization is terminated or revoked sooner.    Influenza A by PCR NEGATIVE NEGATIVE Final   Influenza B by PCR NEGATIVE NEGATIVE Final    Comment: (NOTE) The Xpert Xpress SARS-CoV-2/FLU/RSV assay is intended as an aid in  the diagnosis of influenza from Nasopharyngeal swab specimens and  should not be used as a sole basis for treatment. Nasal washings and  aspirates are unacceptable for Xpert Xpress SARS-CoV-2/FLU/RSV  testing. Fact Sheet for Patients: [place] Fact Sheet for Healthcare Providers: https://www.moore.com/ This test is not yet approved or cleared by the https://www.young.biz/ FDA and  has been authorized for detection and/or diagnosis of SARS-CoV-2 by  FDA under  an Emergency Use Authorization (EUA). This EUA will remain  in effect (meaning this test can be used) for the duration of the  Covid-19 declaration under Section 564(b)(1) of the Act, 21  U.S.C. section 360bbb-3(b)(1), unless the authorization is  terminated or revoked. Performed at [facility], [place] [address] [place], [place] [address]       Studies: No results found.  Scheduled Meds: . apixaban  10 mg Oral BID   Followed by  . [START ON 04/05/2019] apixaban  5 mg Oral BID  . vitamin C  500 mg Oral Daily  . aspirin EC  81 mg Oral Daily  . carbidopa-levodopa  1 tablet Oral TID AC  . cholecalciferol  1,000 Units Oral Daily  . dexamethasone  6 mg Oral Daily  . donepezil  10 mg Oral Daily  . furosemide  20 mg  Intravenous BID  . insulin aspart  0-5 Units Subcutaneous QHS  . insulin aspart  0-9 Units Subcutaneous TID WC  . Ipratropium-Albuterol  1 puff Inhalation TID  . omega-3 acid ethyl esters  1 g Oral Daily  . QUEtiapine  12.5 mg Oral BID  . rasagiline  1 mg Oral Daily  . simethicone  80 mg Oral TID PC  . sodium chloride flush  3 mL Intravenous Q12H  . sodium chloride flush  3 mL Intravenous Q12H  . zinc sulfate  220 mg Oral Daily    Continuous Infusions: . sodium chloride    . remdesivir 100 mg in NS 100 mL 100 mg (03/31/19 0956)     LOS: 2 days     Kayleen Memos, MD [facility] Pager [phone]  If 7PM-7AM, please contact night-coverage [email or web address] Password [email or web address] 03/31/2019, 1:39 PM

## 2019-03-31 NOTE — Progress Notes (Addendum)
Patient was agitated and attempting to elope at shift change. IV Ativan once and soft wrist restraints ordered by provider. Ativan and seroquel given. Restraints deferred until there is a time more necessary for them. Calmed down after a while. More easily redirectable. Low bed and mats are on floor for fall prevention/safety. Mittens on to avoid pulling at medical equipment. bilat wrist Restraints not needed tonight.

## 2019-04-01 DIAGNOSIS — I484 Atypical atrial flutter: Secondary | ICD-10-CM

## 2019-04-01 DIAGNOSIS — I4892 Unspecified atrial flutter: Secondary | ICD-10-CM

## 2019-04-01 DIAGNOSIS — U071 COVID-19: Principal | ICD-10-CM

## 2019-04-01 DIAGNOSIS — I2699 Other pulmonary embolism without acute cor pulmonale: Secondary | ICD-10-CM

## 2019-04-01 DIAGNOSIS — G20 Parkinson's disease: Secondary | ICD-10-CM

## 2019-04-01 LAB — CBC WITH DIFFERENTIAL/PLATELET
Abs Immature Granulocytes: 0.09 10*3/uL — ABNORMAL HIGH (ref 0.00–0.07)
Basophils Absolute: 0 10*3/uL (ref 0.0–0.1)
Basophils Relative: 0 %
Eosinophils Absolute: 0 10*3/uL (ref 0.0–0.5)
Eosinophils Relative: 0 %
HCT: 44.9 % (ref 39.0–52.0)
Hemoglobin: 14.7 g/dL (ref 13.0–17.0)
Immature Granulocytes: 1 %
Lymphocytes Relative: 8 %
Lymphs Abs: 1.3 10*3/uL (ref 0.7–4.0)
MCH: 30.3 pg (ref 26.0–34.0)
MCHC: 32.7 g/dL (ref 30.0–36.0)
MCV: 92.6 fL (ref 80.0–100.0)
Monocytes Absolute: 1.5 10*3/uL — ABNORMAL HIGH (ref 0.1–1.0)
Monocytes Relative: 9 %
Neutro Abs: 13.4 10*3/uL — ABNORMAL HIGH (ref 1.7–7.7)
Neutrophils Relative %: 82 %
Platelets: 271 10*3/uL (ref 150–400)
RBC: 4.85 MIL/uL (ref 4.22–5.81)
RDW: 13.9 % (ref 11.5–15.5)
WBC: 16.2 10*3/uL — ABNORMAL HIGH (ref 4.0–10.5)
nRBC: 0 % (ref 0.0–0.2)

## 2019-04-01 LAB — LACTATE DEHYDROGENASE: LDH: 456 U/L — ABNORMAL HIGH (ref 98–192)

## 2019-04-01 LAB — COMPREHENSIVE METABOLIC PANEL
ALT: 19 U/L (ref 0–44)
AST: 108 U/L — ABNORMAL HIGH (ref 15–41)
Albumin: 2.6 g/dL — ABNORMAL LOW (ref 3.5–5.0)
Alkaline Phosphatase: 82 U/L (ref 38–126)
Anion gap: 11 (ref 5–15)
BUN: 28 mg/dL — ABNORMAL HIGH (ref 8–23)
CO2: 23 mmol/L (ref 22–32)
Calcium: 8.5 mg/dL — ABNORMAL LOW (ref 8.9–10.3)
Chloride: 102 mmol/L (ref 98–111)
Creatinine, Ser: 1.35 mg/dL — ABNORMAL HIGH (ref 0.61–1.24)
GFR calc Af Amer: 53 mL/min — ABNORMAL LOW (ref >60)
GFR calc non Af Amer: 46 mL/min — ABNORMAL LOW (ref >60)
Glucose, Bld: 98 mg/dL (ref 70–99)
Potassium: 3.7 mmol/L (ref 3.5–5.1)
Sodium: 136 mmol/L (ref 135–145)
Total Bilirubin: 0.5 mg/dL (ref 0.3–1.2)
Total Protein: 6.9 g/dL (ref 6.5–8.1)

## 2019-04-01 LAB — GLUCOSE, CAPILLARY
Glucose-Capillary: 97 mg/dL (ref 70–99)
Glucose-Capillary: 134 mg/dL — ABNORMAL HIGH (ref 70–99)
Glucose-Capillary: 131 mg/dL — ABNORMAL HIGH (ref 70–99)

## 2019-04-01 LAB — FERRITIN: Ferritin: 215 ng/mL (ref 24–336)

## 2019-04-01 LAB — C-REACTIVE PROTEIN: CRP: 4.2 mg/dL — ABNORMAL HIGH (ref <1.0)

## 2019-04-01 LAB — D-DIMER, QUANTITATIVE: D-Dimer, Quant: 6.67 ug/mL-FEU — ABNORMAL HIGH (ref 0.00–0.50)

## 2019-04-01 MED ORDER — DEXAMETHASONE 6 MG PO TABS: 6.0000 mg | ORAL_TABLET | Freq: Every day | ORAL | 0 refills | Status: AC

## 2019-04-01 MED ORDER — QUETIAPINE FUMARATE 25 MG PO TABS
12.5000 mg | ORAL_TABLET | Freq: Every evening | ORAL | Status: DC | PRN
  Administered 2019-04-03 – 2019-04-04 (×2): 12.5 mg via ORAL
  Filled 2019-04-01 (×2): qty 1

## 2019-04-01 MED ORDER — METOPROLOL TARTRATE 25 MG PO TABS: 25.0000 mg | ORAL_TABLET | Freq: Two times a day (BID) | ORAL | Status: DC

## 2019-04-01 MED ORDER — APIXABAN 5 MG PO TABS: ORAL_TABLET | ORAL | 60 refills | Status: AC

## 2019-04-01 MED ORDER — AMIODARONE LOAD VIA INFUSION
150.0000 mg | Freq: Once | INTRAVENOUS | Status: AC
  Administered 2019-04-01: 150 mg via INTRAVENOUS
  Filled 2019-04-01: qty 83.34

## 2019-04-01 MED ORDER — AMIODARONE HCL IN DEXTROSE 360-4.14 MG/200ML-% IV SOLN
60.0000 mg/h | INTRAVENOUS | Status: AC
  Administered 2019-04-01 (×2): 60 mg/h via INTRAVENOUS
  Filled 2019-04-01: qty 200

## 2019-04-01 MED ORDER — METOPROLOL TARTRATE 5 MG/5ML IV SOLN
2.5000 mg | Freq: Once | INTRAVENOUS | Status: AC
  Administered 2019-04-01: 2.5 mg via INTRAVENOUS
  Filled 2019-04-01: qty 5

## 2019-04-01 MED ORDER — AMIODARONE HCL IN DEXTROSE 360-4.14 MG/200ML-% IV SOLN
30.0000 mg/h | INTRAVENOUS | Status: DC
  Administered 2019-04-01: 30 mg/h via INTRAVENOUS
  Filled 2019-04-01 (×7): qty 200

## 2019-04-01 MED ORDER — RAMELTEON 8 MG PO TABS
8.0000 mg | ORAL_TABLET | Freq: Every day | ORAL | Status: DC
  Administered 2019-04-01 – 2019-04-04 (×4): 8 mg via ORAL
  Filled 2019-04-01 (×6): qty 1

## 2019-04-01 MED ORDER — METOPROLOL TARTRATE 12.5 MG HALF TABLET
12.5000 mg | ORAL_TABLET | Freq: Two times a day (BID) | ORAL | Status: DC
  Administered 2019-04-01: 12.5 mg via ORAL
  Filled 2019-04-01 (×6): qty 1

## 2019-04-01 NOTE — Progress Notes (Signed)
  Amiodarone Drug - Drug Interaction Consult Note  Recommendations: If DC home on amiodarone please restart simvastatin at 20mg  QD - per patient he was not taking despite on PTA list  Aricept and Amiodarone can increase QTc increase risk of  Arrhythmia QTc 450-500 today   Amiodarone is metabolized by the cytochrome P450 system and therefore has the potential to cause many drug interactions. Amiodarone has an average plasma half-life of 50 days (range 20 to 100 days).   There is potential for drug interactions to occur several weeks or months after stopping treatment and the onset of drug interactions may be slow after initiating amiodarone.   []  Statins: Increased risk of myopathy. Simvastatin- restrict dose to 20mg  daily. Other statins: counsel patients to report any muscle pain or weakness immediately.  []  Anticoagulants: Amiodarone can increase anticoagulant effect. Consider warfarin dose reduction. Patients should be monitored closely and the dose of anticoagulant altered accordingly, remembering that amiodarone levels take several weeks to stabilize.  []  Antiepileptics: Amiodarone can increase plasma concentration of phenytoin, the dose should be reduced. Note that small changes in phenytoin dose can result in large changes in levels. Monitor patient and counsel on signs of toxicity.  []  Beta blockers: increased risk of bradycardia, AV block and myocardial depression. Sotalol - avoid concomitant use.  []   Calcium channel blockers (diltiazem and verapamil): increased risk of bradycardia, AV block and myocardial depression.  []   Cyclosporine: Amiodarone increases levels of cyclosporine. Reduced dose of cyclosporine is recommended.  []  Digoxin dose should be halved when amiodarone is started.  []  Diuretics: increased risk of cardiotoxicity if hypokalemia occurs.  []  Oral hypoglycemic agents (glyburide, glipizide, glimepiride): increased risk of hypoglycemia. Patient's glucose levels  should be monitored closely when initiating amiodarone therapy.   []  Drugs that prolong the QT interval:  Torsades de pointes risk may be increased with concurrent use - avoid if possible.  Monitor QTc, also keep magnesium/potassium WNL if concurrent therapy can't be avoided. Antibiotics: e.g. fluoroquinolones, erythromycin. . Antiarrhythmics: e.g. quinidine, procainamide, disopyramide, sotalol. . Antipsychotics: e.g. phenothiazines, haloperidol.  . Lithium, tricyclic antidepressants, and methadone.   Pharm.D. CPP, BCPS Clinical Pharmacist [phone] 04/01/2019 3:56 PM

## 2019-04-01 NOTE — Plan of Care (Signed)
On call provider notified of patient's current vital signs and sinus brady on Amiodarone gtt. Provider recommend turning off gtt and monitor patient, turn on if patient becomes tachy. Patient is resting at the moment. No difficulties noted at this time. Will continue to monitor.

## 2019-04-01 NOTE — Progress Notes (Signed)
Pt HR increased during ambulation to high 150s. Back at rest, HR continued to sustain in 130s. Dr. Terisa Starr notified. EKG performed. No c/o from pt. BP 111/78. Will follow instructions to give 2.5mg  IV lopressor and continue to monitor.

## 2019-04-01 NOTE — Progress Notes (Signed)
PROGRESS NOTE  Alexander Novak URK:[id number] DOB: 18-Feb-1926 DOA: 03/28/2019 PCP: Jonathon Jordan, MD  HPI/Recap of past 73 hours: 86 year old with history of CAD, HTN, dementia, Parkinson's disease came to the ER with complaints of chest discomfort and cough.    In-house positive screening COVID-19 03/28/2019.  Chest x-ray bilateral pulmonary infiltrates.  Was diagnosed with COVID-19 pneumonia.  Chest x-ray showed mild interstitial prominence.  CTA showed small pulmonary embolism.  Hospital course complicated by delirium resolved and new onset atrial fibrillation.  04/01/19: Patient ambulating prior to discharge, HR elevated to 150s, found to be in new atrial flutter. Already on anticoagulation, echocardiogram ordered, consulted Cardiology.   Assessment/Plan: Principal Problem:   Pulmonary embolism (HCC) Active Problems:   CORONARY ATHEROSCLEROSIS NATIVE CORONARY ARTERY   Parkinson disease ([place])   COVID-19 virus detected   Atrial flutter ([place])  Acute pulmonary embolism without cor pulmonale COVID-19 pneumonia/infection -Presented with chest discomfort and cough was found to have a pulmonary embolism.  -Started on Eliquis on 03/29/2019. Currently on room air. -Remdesivir-day 5/5 -p.o. Decadron 6 mg daily -day 4/10 Continue bronchodilators combivent 1 puff 3 times daily. Continue vitamin C, D3 and zinc.   Procalcitonin negative  Inflammatory markers are trending down  New atrial fibrillation/flutter - Possibly due to acute infection,electrolytes normal. Denies symptoms. - Anticoagulated on apixaban currently. - Echocardiogram, TSH ordered. - Has known CAD - Cardiology consulted, given 2.5 mg IV metoprolol   Acute metabolic encephalopathy, suspect delirium, improving.  - Discontinued restraints, would avoid as worsen delirium - Discontinue Seroquel given new atrial fibrillation, only indicated if threat to self or others. No indication for prophylaxis/treatment for delirium if  only confused---increases cardiac events  Fall precautions in place Reorient as needed  Parkinson's disease -Continue home Sinemet and rasagiline   Coronary artery disease -Denies any anginal symptoms. - Appreciate Cardiology recommendations re: aspirin   DVT prophylaxis: Eliquis Code Status: DNR Family Communication:   Updated his spouse via phone on 04/01/2019.   Disposition Plan: Patient is from home.  Plan is to discharge to home.  Barrier to discharge: Rate control for new atrial fibrillation/flutter    Objective: Vitals:   04/01/19 1139 04/01/19 1142 04/01/19 1144 04/01/19 1217  BP:    111/78  Pulse: 80   (!) 110  Resp: (!) 24   19  Temp:    98.9 F (37.2 C)  TempSrc:    Axillary  SpO2: 95% 94% 98% 98%  Weight:      Height:        Intake/Output Summary (Last 24 hours) at 04/01/2019 1250 Last data filed at 04/01/2019 1246 Gross per 24 hour  Intake 220 ml  Output 1750 ml  Net -1530 ml   Filed Weights   03/28/19 1335 03/29/19 0005  Weight: 81.6 kg 80.1 kg    Exam:  . General: 86 y.o. year-old male well-developed well-nourished no acute distress.  Alert--knows place and day of week. Uncertain of year. Ambulating around room yesterday to chair  . Cardiovascular: Regular rate and rhythm no rubs or gallops.  Exam later in day- tachycardic, irregular rate  . Respiratory: Mild rales at bases no wheezing noted.   .   Abdomen: Soft nontender normal bowel sounds present.   . Musculoskeletal: No lower extremity edema bilaterally.      Data Reviewed: CBC: Recent Labs  Lab 03/28/19 1337 03/29/19 0232 03/30/19 0252 03/31/19 0734 04/01/19 0323  WBC 8.8 8.9 10.4 18.0* 16.2*  NEUTROABS  --  5.2 8.5* 15.1*  13.4*  HGB 13.3 12.8* 13.9 15.4 14.7  HCT 42.5 38.5* 42.1 46.8 44.9  MCV 96.2 92.5 91.3 91.1 92.6  PLT 204 192 238 273 271   Basic Metabolic Panel: Recent Labs  Lab 03/28/19 1337 03/29/19 0232 03/30/19 0252 03/31/19 0734 04/01/19 0323  NA 137 137 138  136 136  K 4.3 4.3 4.5 4.5 3.7  CL 105 106 105 106 102  CO2 25 23 21* 18* 23  GLUCOSE 114* 101* 105* 90 98  BUN 13 9 16 19  28*  CREATININE 1.23 1.07 1.18 1.13 1.35*  CALCIUM 8.7* 8.4* 8.9 9.0 8.5*  MG  --  1.9  --   --   --    GFR: Estimated Creatinine Clearance: 37.4 mL/min (A) (by C-G formula based on SCr of 1.35 mg/dL (H)). Liver Function Tests:   Studies: No results found.  Scheduled Meds: . apixaban  10 mg Oral BID   Followed by  . [START ON 04/05/2019] apixaban  5 mg Oral BID  . vitamin C  500 mg Oral Daily  . aspirin EC  81 mg Oral Daily  . carbidopa-levodopa  1 tablet Oral TID AC  . cholecalciferol  1,000 Units Oral Daily  . dexamethasone  6 mg Oral Daily  . donepezil  10 mg Oral Daily  . insulin aspart  0-5 Units Subcutaneous QHS  . insulin aspart  0-9 Units Subcutaneous TID WC  . Ipratropium-Albuterol  1 puff Inhalation TID  . omega-3 acid ethyl esters  1 g Oral Daily  . QUEtiapine  12.5 mg Oral BID  . rasagiline  1 mg Oral Daily  . simethicone  80 mg Oral TID PC  . sodium chloride flush  3 mL Intravenous Q12H  . sodium chloride flush  3 mL Intravenous Q12H  . zinc sulfate  220 mg Oral Daily    Continuous Infusions: . sodium chloride       LOS: 3 days     06/03/2019, MD [facility] Pager [phone]  If 7PM-7AM, please contact night-coverage [email or web address] Password [email or web address] 04/01/2019, 12:50 PM

## 2019-04-01 NOTE — Care Management (Addendum)
Notified Cassie w [facility] that patient will likely DC today and that there are now some labs for the [facility] RN in the order.  Sent image of Eliquis card to patient's wife. She understands to show this to the pharmacist for the free 30 day supply.

## 2019-04-01 NOTE — Progress Notes (Signed)
Ambulated with pt in hallway to measure spO2. At rest, spO2 95% on RA. While ambulating 17ft in hallway spO2 maintained at 94% or greater on RA. Pt returned to room where spO2 increased to 98% at rest on RA.

## 2019-04-01 NOTE — Consult Note (Signed)
Cardiology Consultation:   Patient ID: Alexander Novak MRN: [id number]; DOB: 09/20/1925  Admit date: 03/28/2019 Date of Consult: 04/01/2019  Primary Care Provider: Mila PalmerWolters, Sharon, MD Primary Cardiologist: Rollene RotundaJames Hochrein, MD  Primary Electrophysiologist:  None    Patient Profile:   Alexander Novak is a 86 y.o. male with a hx of CAD, HTN, HLD, AAA s/p repair, PAD and Parkinson's disease who is being seen today for the evaluation of atrial fibrillation at the request of Dr. Manson PasseyBrown.  History of Present Illness:   Alexander Novak is a 86 year old male with past medical history of CAD, HTN, HLD, AAA s/p repair, PAD, dementia and Parkinson's disease.  He suffered a acute inferior infarct in 1992.  He underwent PCI of proximal left circumflex in 1997 was distal occluded AV circumflex artery.  He has bilateral femoral artery occlusion that was managed medically.  Carotid artery ultrasound obtained on 08/19/2017 showed 1 to 39% bilateral stenosis.  Patient presented to the hospital on 03/28/2019 with episodes of chest pain while walking to the mailbox.  His wife also reported cough as well, but no fever or chills.  He did receive the first COVID-19 vaccine on 1/19.  Covid 19 test performed in the ED came back positive.  D-dimer was also elevated as well.  CT angiogram of the chest showed a small PE in the left upper lobe.  Chest x-ray showed patchy infiltrates.  Patient was admitted to hospitalist service.  He was started on Eliquis on 03/29/2019.  During the hospitalization, he was treated with Decadron, breathing treatment and remdesivir.  Around 11:40 AM on 04/01/2019, she went into atrial fibrillation, then atrial flutter with variable ventricular response.  Cardiology service consulted for atrial flutter with RVR.   Heart Pathway Score:     Past Medical History:  Diagnosis Date  . Abdominal aortic aneurysm (HCC)    repaired by Dr. Madilyn FiremanHayes  . CAD (coronary artery disease)    Acute inferior infarct in  1992. PTCA of her current symptoms in 1997 with a distal occluded AV circumflex. He had a proximal 95% stenosis that was treated with PCI)  . Dementia (HCC)   . HTN (hypertension)   . Hyperlipidemia   . Parkinson disease (HCC)   . PVD (peripheral vascular disease) (HCC)    Bilateral femoral artery occlusion managed medically    Past Surgical History:  Procedure Laterality Date  . ABDOMINAL AORTIC ANEURYSM REPAIR       Home Medications:  Prior to Admission medications   Medication Sig Start Date End Date Taking? Authorizing Provider  Ascorbic Acid (VITAMIN C) 1000 MG tablet Take 1,000 mg by mouth 2 (two) times daily.    Yes [provider]  aspirin EC 81 MG tablet Take 81 mg by mouth daily.   Yes [provider]  B Complex-Biotin-FA (B COMPLETE) TABS Take 1 tablet by mouth daily.   Yes [provider]  carbidopa-levodopa (SINEMET IR) 25-100 MG tablet Take 1 tablet by mouth 3 (three) times daily. Patient taking differently: Take 1 tablet by mouth 3 (three) times daily before meals.  03/08/18  Yes Nilda RiggsMartin, Nancy Carolyn, NP  Coenzyme Q10 (CO Q10) 100 MG CAPS Take 100 mg by mouth daily.   Yes [provider]  donepezil (ARICEPT) 10 MG tablet Take 1 tablet (10 mg total) by mouth daily. 03/08/18  Yes Nilda RiggsMartin, Nancy Carolyn, NP  fish oil-omega-3 fatty acids 1000 MG capsule Take 2 g by mouth daily.   Yes [provider]  Ginkgo 60 MG TABS Take 120 mg by mouth 2 (two) times daily.    Yes [provider]  Magnesium 250 MG TABS Take 250-500 mg by mouth 4 (four) times daily.   Yes [provider]  NON FORMULARY Take 1 capsule by mouth See admin instructions. Procera Advanced Brain - 3-in-1 Nootropic Brain Supplement  Memory & Mood Support w/Energy Vitamins  Ashwagandha, Rhodiola, Ginseng, Ginkgo, Phosphatidylserine & Vitamin B Complex: Take 1 capsule by mouth once a day   Yes [provider]  OVER THE COUNTER MEDICATION Take 1 tablet  by mouth See admin instructions. [employer] sleeping tablets (generic Unisom, Benadryl, or Melatonin??): Take 1 tablet by mouth at bedtime as needed for sleep   Yes [provider]  rasagiline (AZILECT) 1 MG TABS tablet Take 1 tablet (1 mg total) by mouth daily. 03/08/18  Yes Nilda Riggs, NP  simethicone (MYLICON) 80 MG chewable tablet Chew 80 mg by mouth 3 (three) times daily after meals.    Yes [provider]  UNABLE TO FIND Take 1 capsule by mouth See admin instructions. Med Name: Immune Health Support capsule: Take 1 capsule by mouth once a day   Yes [provider]  vitamin E 400 UNIT capsule Take 400 Units by mouth daily.   Yes [provider]  apixaban (ELIQUIS) 5 MG TABS tablet Take TWO tablets TWICE a day for 5 days then take one tablet TWICE per day 04/01/19   Westley Chandler, MD  dexamethasone (DECADRON) 6 MG tablet Take 1 tablet (6 mg total) by mouth daily. 04/02/19   Westley Chandler, MD  LevOCARNitine (CARNITINE PO) Take 500 mg by mouth daily.    [provider]  simvastatin (ZOCOR) 40 MG tablet TAKE 1 TABLET BY MOUTH EVERY DAY Patient not taking: Reported on 03/28/2019 11/30/18   Rollene Rotunda, MD    Inpatient Medications: Scheduled Meds: . apixaban  10 mg Oral BID   Followed by  . [START ON 04/05/2019] apixaban  5 mg Oral BID  . vitamin C  500 mg Oral Daily  . carbidopa-levodopa  1 tablet Oral TID AC  . cholecalciferol  1,000 Units Oral Daily  . dexamethasone  6 mg Oral Daily  . donepezil  10 mg Oral Daily  . insulin aspart  0-5 Units Subcutaneous QHS  . insulin aspart  0-9 Units Subcutaneous TID WC  . Ipratropium-Albuterol  1 puff Inhalation TID  . omega-3 acid ethyl esters  1 g Oral Daily  . rasagiline  1 mg Oral Daily  . simethicone  80 mg Oral TID PC  . sodium chloride flush  3 mL Intravenous Q12H  . sodium chloride flush  3 mL Intravenous Q12H  . zinc sulfate  220 mg Oral Daily   Continuous Infusions: . sodium chloride       PRN Meds: sodium chloride, acetaminophen, ondansetron **OR** ondansetron (ZOFRAN) IV, senna-docusate, sodium chloride flush  Allergies:   No Known Allergies  Social History:   Social History   Socioeconomic History  . Marital status: Married    Spouse name: Sallye Ober  . Number of children: 4  . Years of education: college  . Highest education level: Not on file  Occupational History    Employer: RETIRED    Comment: Retired  Tobacco Use  . Smoking status: Former Smoker    Quit date: 04/02/1979    Years since quitting: 40.0  . Smokeless tobacco: Never Used  Substance and Sexual Activity  . Alcohol  use: No  . Drug use: No  . Sexual activity: Not on file  Other Topics Concern  . Not on file  Social History Narrative   Patient is retired Chief Financial Officer / Radio producer. Patient has a college education. Patient is married to Esko    Caffeine- None    Right handed.      Social Determinants of Health   Financial Resource Strain:   . Difficulty of Paying Living Expenses: Not on file  Food Insecurity:   . Worried About Charity fundraiser in the Last Year: Not on file  . Ran Out of Food in the Last Year: Not on file  Transportation Needs:   . Lack of Transportation (Medical): Not on file  . Lack of Transportation (Non-Medical): Not on file  Physical Activity:   . Days of Exercise per Week: Not on file  . Minutes of Exercise per Session: Not on file  Stress:   . Feeling of Stress : Not on file  Social Connections:   . Frequency of Communication with Friends and Family: Not on file  . Frequency of Social Gatherings with Friends and Family: Not on file  . Attends Religious Services: Not on file  . Active Member of Clubs or Organizations: Not on file  . Attends Archivist Meetings: Not on file  . Marital Status: Not on file  Intimate Partner Violence:   . Fear of Current or Ex-Partner: Not on file  . Emotionally Abused: Not on file  . Physically Abused: Not on file  .  Sexually Abused: Not on file    Family History:    Family History  Problem Relation Age of Onset  . Pneumonia Mother   . Stroke Father   . Heart disease Other      ROS:  Please see the history of present illness.   All other ROS reviewed and negative.     Physical Exam/Data:   Vitals:   04/01/19 1139 04/01/19 1142 04/01/19 1144 04/01/19 1217  BP:    111/78  Pulse: 80   (!) 110  Resp: (!) 24   19  Temp:    98.9 F (37.2 C)  TempSrc:    Axillary  SpO2: 95% 94% 98% 98%  Weight:      Height:        Intake/Output Summary (Last 24 hours) at 04/01/2019 1346 Last data filed at 04/01/2019 1246 Gross per 24 hour  Intake 120 ml  Output 1750 ml  Net -1630 ml   Last 3 Weights 03/29/2019 03/28/2019 10/17/2018  Weight (lbs) 176 lb 9.4 oz 180 lb 173 lb  Weight (kg) 80.1 kg 81.647 kg 78.472 kg     Body mass index is 24.98 kg/m.  General:  Well nourished, well developed, in no acute distress HEENT: normal Lymph: no adenopathy Neck: no JVD Endocrine:  No thryomegaly Vascular: No carotid bruits; FA pulses 2+ bilaterally without bruits  Cardiac:  normal S1, S2; RRR; no murmur  Lungs:  clear to auscultation bilaterally, no wheezing, rhonchi or rales  Abd: soft, nontender, no hepatomegaly  Ext: no edema Musculoskeletal:  No deformities, BUE and BLE strength normal and equal Skin: warm and dry  Neuro:  CNs 2-12 intact, no focal abnormalities noted Psych:  Normal affect   EKG:  The EKG was personally reviewed and demonstrates: Atrial flutter, heart rate of 128 Telemetry:  Telemetry was personally reviewed and demonstrates: Atrial flutter started around 11:40 AM this morning.  Currently in atrial flutter with  average heart rate 100-110s  Relevant CV Studies:  N/A  Laboratory Data:  High Sensitivity Troponin:   Recent Labs  Lab 03/28/19 1337 03/28/19 1537  TROPONINIHS 13 12     Chemistry Recent Labs  Lab 03/30/19 0252 03/31/19 0734 04/01/19 0323  NA 138 136 136  K  4.5 4.5 3.7  CL 105 106 102  CO2 21* 18* 23  GLUCOSE 105* 90 98  BUN 16 19 28*  CREATININE 1.18 1.13 1.35*  CALCIUM 8.9 9.0 8.5*  GFRNONAA 54* 57* 46*  GFRAA >60 >60 53*  ANIONGAP 12 12 11     Recent Labs  Lab 03/30/19 0252 03/31/19 0734 04/01/19 0323  PROT 7.4 7.4 6.9  ALBUMIN 2.9* 2.9* 2.6*  AST 63* 182* 108*  ALT 15 42 19  ALKPHOS 82 94 82  BILITOT 0.5 1.0 0.5   Hematology Recent Labs  Lab 03/30/19 0252 03/31/19 0734 04/01/19 0323  WBC 10.4 18.0* 16.2*  RBC 4.61 5.14 4.85  HGB 13.9 15.4 14.7  HCT 42.1 46.8 44.9  MCV 91.3 91.1 92.6  MCH 30.2 30.0 30.3  MCHC 33.0 32.9 32.7  RDW 13.6 13.6 13.9  PLT 238 273 271   BNP Recent Labs  Lab 03/28/19 2210  BNP 231.1*    DDimer  Recent Labs  Lab 03/30/19 0252 03/31/19 0734 04/01/19 0323  DDIMER 6.58* 8.20* 6.67*     Radiology/Studies:  DG Chest 2 View  Result Date: 03/28/2019 CLINICAL DATA:  Chest pain EXAM: CHEST - 2 VIEW COMPARISON:  2014 FINDINGS: Increased chronic interstitial prominence. No pleural effusion or pneumothorax. Possible pleural calcification at the left lung base. Cardiomediastinal contours are within normal limits with normal heart size. There is mild calcified plaque along the thoracic aorta. Surgical clips overlie the upper abdomen. No acute osseous abnormality. IMPRESSION: Increased interstitial prominence, which may reflect interstitial lung disease. Electronically Signed   By: 2015 M.D.   On: 03/28/2019 14:10   CT Angio Chest PE W/Cm &/Or Wo Cm  Result Date: 03/28/2019 CLINICAL DATA:  Intermittent chest pain and elevated D-dimer. EXAM: CT ANGIOGRAPHY CHEST WITH CONTRAST TECHNIQUE: Multidetector CT imaging of the chest was performed using the standard protocol during bolus administration of intravenous contrast. Multiplanar CT image reconstructions and MIPs were obtained to evaluate the vascular anatomy. CONTRAST:  62mL OMNIPAQUE IOHEXOL 350 MG/ML SOLN COMPARISON:  None. FINDINGS:  Cardiovascular: There is moderate to marked severity calcification of the thoracic aorta. A very small amount of intraluminal low attenuation is seen within and upper lobe branch of the left pulmonary artery (axial CT image 43, CT series number 5/sagittal reformatted image 127, CT series number 9). Normal heart size. No pericardial effusion. Marked severity coronary artery calcification is seen. Mediastinum/Nodes: No enlarged mediastinal, hilar, or axillary lymph nodes. Thyroid gland, trachea, and esophagus demonstrate no significant findings. Lungs/Pleura: Mild emphysematous lung disease is seen involving the bilateral upper lobes. Mild to moderate severity patchy areas of atelectasis and/or infiltrate are seen along the periphery of both lungs. There is no evidence of a pleural effusion or pneumothorax. Upper Abdomen: Numerous subcentimeter gallstones are seen within the lumen of an otherwise normal-appearing gallbladder. Musculoskeletal: Multilevel degenerative changes are seen throughout the thoracic spine Review of the MIP images confirms the above findings. IMPRESSION: 1. Very small amount of pulmonary embolism seen involving an upper lobe branch of the left pulmonary artery. 2. Mild to moderate severity patchy bilateral atelectasis and/or infiltrates. 3. Cholelithiasis. Electronically Signed   By: 91m.D.  On: 03/28/2019 21:29     Assessment and Plan:   1. Atrial fibrillation/Atrial flutter with variable ventricular rate: Started around 11:40 AM on 04/01/2019.  Heart rate currently in the 100-110s range after single 2.5 mg IV Lopressor.  Occurred in the setting of COVID-19 pneumonia and small PE.  Patient has been recovering well after the COVID-19 and is planning for discharge.  EKG demonstrated prior to the onset of atrial fibrillation, patient had frequent PACs and occasional PVCs.  He is currently on Eliquis for small PE.  Will start on metoprolol tartrate 25 mg twice daily.  Will  discuss with MD to decide whether or not to proceed with amiodarone loading since onset of atrial flutter is within 24 hours in attempt to convert him overnight  - pending echo and TSH   2. COVID-19 pneumonia: Diagnosed during this admission.  3. Small PE: Likely related to hypercoagulable state with COVID-19.  Started on Eliquis during this admission, currently on 10 mg twice daily dosing, will need to transition to 5 mg twice daily dosing after 7 days  4. CAD: Although he came in with chest pain, however chest pain likely related to the small PE.  Reassess on follow-up 4 weeks after discharge  - D/C home Aspirin given the need for eliquis.  May consider reintroduce aspirin later once he is off of Eliquis in 3-6 month.  5. Hypertension: Blood pressure stable, at low-dose metoprolol for rate control  6. Hyperlipidemia: On Zocor at home  7. AAA s/p repair  8. Dementia     For questions or updates, please contact [facility] Please consult [email or web address] for contact info under     Signed, Azalee Course, PA  04/01/2019 1:46 PM  Personally seen and examined. Agree with above.   86 year old with Covid, PE small burden that who was ready to go home, ambulated went into atrial fibrillation.  Currently asymptomatic, laying comfortably in bed.  Asking when his car is going to be ready.  GEN: Well nourished, well developed, in no acute distress, elderly HEENT: normal  Neck: no JVD, carotid bruits, or masses Cardiac: Irregularly irregular; no murmurs, rubs, or gallops,no edema  Respiratory:  clear to auscultation bilaterally, normal work of breathing GI: soft, nontender, nondistended, + BS MS: no deformity or atrophy  Skin: warm and dry, no rash Neuro:  Alert, Strength and sensation are intact Psych: euthymic mood  Paroxysmal atrial fibrillation -Heart rate currently in the 110s range. -We will go ahead and try to place him on amiodarone IV load to see if we can potentiate a  conversion chemically. -Continue with metoprolol tartrate 12.5 twice daily. -He is already on apixaban for PE.  Continue for anticoagulation.  CHA2DS2-VASc 5.  Small PE burden -On apixaban.  Likely will be treated for total of 3 to 6 months.  Can stop aspirin.  CAD -Stable, no anginal symptoms.  Chest pain likely PE.  AAA status post repair.  Dementia -Noted on exam.  Donato Schultz, MD

## 2019-04-02 ENCOUNTER — Inpatient Hospital Stay (HOSPITAL_COMMUNITY): Payer: Medicare Other

## 2019-04-02 DIAGNOSIS — I4891 Unspecified atrial fibrillation: Secondary | ICD-10-CM

## 2019-04-02 LAB — CBC WITH DIFFERENTIAL/PLATELET
Abs Immature Granulocytes: 0.13 10*3/uL — ABNORMAL HIGH (ref 0.00–0.07)
Basophils Absolute: 0 10*3/uL (ref 0.0–0.1)
Basophils Relative: 0 %
Eosinophils Absolute: 0 10*3/uL (ref 0.0–0.5)
Eosinophils Relative: 0 %
HCT: 44.6 % (ref 39.0–52.0)
Hemoglobin: 14.7 g/dL (ref 13.0–17.0)
Immature Granulocytes: 1 %
Lymphocytes Relative: 7 %
Lymphs Abs: 1.3 10*3/uL (ref 0.7–4.0)
MCH: 30.3 pg (ref 26.0–34.0)
MCHC: 33 g/dL (ref 30.0–36.0)
MCV: 92 fL (ref 80.0–100.0)
Monocytes Absolute: 1.3 10*3/uL — ABNORMAL HIGH (ref 0.1–1.0)
Monocytes Relative: 7 %
Neutro Abs: 14.9 10*3/uL — ABNORMAL HIGH (ref 1.7–7.7)
Neutrophils Relative %: 85 %
Platelets: 311 10*3/uL (ref 150–400)
RBC: 4.85 MIL/uL (ref 4.22–5.81)
RDW: 13.9 % (ref 11.5–15.5)
WBC: 17.6 10*3/uL — ABNORMAL HIGH (ref 4.0–10.5)
nRBC: 0 % (ref 0.0–0.2)

## 2019-04-02 LAB — COMPREHENSIVE METABOLIC PANEL
ALT: 17 U/L (ref 0–44)
AST: 63 U/L — ABNORMAL HIGH (ref 15–41)
Albumin: 2.6 g/dL — ABNORMAL LOW (ref 3.5–5.0)
Alkaline Phosphatase: 80 U/L (ref 38–126)
Anion gap: 11 (ref 5–15)
BUN: 44 mg/dL — ABNORMAL HIGH (ref 8–23)
CO2: 25 mmol/L (ref 22–32)
Calcium: 8.8 mg/dL — ABNORMAL LOW (ref 8.9–10.3)
Chloride: 103 mmol/L (ref 98–111)
Creatinine, Ser: 1.62 mg/dL — ABNORMAL HIGH (ref 0.61–1.24)
GFR calc Af Amer: 42 mL/min — ABNORMAL LOW (ref >60)
GFR calc non Af Amer: 37 mL/min — ABNORMAL LOW (ref >60)
Glucose, Bld: 118 mg/dL — ABNORMAL HIGH (ref 70–99)
Potassium: 4.1 mmol/L (ref 3.5–5.1)
Sodium: 139 mmol/L (ref 135–145)
Total Bilirubin: 0.6 mg/dL (ref 0.3–1.2)
Total Protein: 7.4 g/dL (ref 6.5–8.1)

## 2019-04-02 LAB — TSH: TSH: 2.191 u[IU]/mL (ref 0.350–4.500)

## 2019-04-02 LAB — ECHOCARDIOGRAM LIMITED
Height: 70.5 in
Weight: 2825.42 oz

## 2019-04-02 LAB — GLUCOSE, CAPILLARY
Glucose-Capillary: 88 mg/dL (ref 70–99)
Glucose-Capillary: 99 mg/dL (ref 70–99)
Glucose-Capillary: 132 mg/dL — ABNORMAL HIGH (ref 70–99)
Glucose-Capillary: 138 mg/dL — ABNORMAL HIGH (ref 70–99)

## 2019-04-02 LAB — MAGNESIUM: Magnesium: 2.2 mg/dL (ref 1.7–2.4)

## 2019-04-02 LAB — C-REACTIVE PROTEIN: CRP: 3.2 mg/dL — ABNORMAL HIGH (ref <1.0)

## 2019-04-02 LAB — FERRITIN: Ferritin: 223 ng/mL (ref 24–336)

## 2019-04-02 NOTE — Plan of Care (Signed)

## 2019-04-02 NOTE — Progress Notes (Addendum)
Progress Note  Patient Name: Alexander Novak Date of Encounter: 04/02/2019  Primary Cardiologist: Rollene Rotunda, MD   Subjective   Pleasant  Inpatient Medications    Scheduled Meds: . apixaban  10 mg Oral BID   Followed by  . [START ON 04/05/2019] apixaban  5 mg Oral BID  . vitamin C  500 mg Oral Daily  . carbidopa-levodopa  1 tablet Oral TID AC  . cholecalciferol  1,000 Units Oral Daily  . dexamethasone  6 mg Oral Daily  . donepezil  10 mg Oral Daily  . insulin aspart  0-5 Units Subcutaneous QHS  . insulin aspart  0-9 Units Subcutaneous TID WC  . Ipratropium-Albuterol  1 puff Inhalation TID  . metoprolol tartrate  12.5 mg Oral BID  . omega-3 acid ethyl esters  1 g Oral Daily  . ramelteon  8 mg Oral QHS  . rasagiline  1 mg Oral Daily  . simethicone  80 mg Oral TID PC  . sodium chloride flush  3 mL Intravenous Q12H  . sodium chloride flush  3 mL Intravenous Q12H  . zinc sulfate  220 mg Oral Daily   Continuous Infusions: . sodium chloride    . amiodarone Stopped (04/01/19 2342)   PRN Meds: sodium chloride, acetaminophen, ondansetron **OR** ondansetron (ZOFRAN) IV, QUEtiapine, senna-docusate, sodium chloride flush   Vital Signs    Vitals:   04/02/19 0305 04/02/19 0412 04/02/19 0904 04/02/19 1000  BP: (!) 148/95 112/65 104/60 98/60  Pulse: (!) 54 (!) 56 64 61  Resp: 17 18  14   Temp:  97.8 F (36.6 C)    TempSrc:  Oral    SpO2: 97% 96%  98%  Weight:      Height:        Intake/Output Summary (Last 24 hours) at 04/02/2019 1139 Last data filed at 04/02/2019 1059 Gross per 24 hour  Intake 851.51 ml  Output 1000 ml  Net -148.49 ml   Last 3 Weights 03/29/2019 03/28/2019 10/17/2018  Weight (lbs) 176 lb 9.4 oz 180 lb 173 lb  Weight (kg) 80.1 kg 81.647 kg 78.472 kg      Telemetry    Converted to sinus rhythm- Personally Reviewed  ECG    Atrial fib/flutter 128- Personally Reviewed  Physical Exam   Pleasant  Labs    High Sensitivity Troponin:   Recent  Labs  Lab 03/28/19 1337 03/28/19 1537  TROPONINIHS 13 12      Chemistry Recent Labs  Lab 03/31/19 0734 04/01/19 0323 04/02/19 0200  NA 136 136 139  K 4.5 3.7 4.1  CL 106 102 103  CO2 18* 23 25  GLUCOSE 90 98 118*  BUN 19 28* 44*  CREATININE 1.13 1.35* 1.62*  CALCIUM 9.0 8.5* 8.8*  PROT 7.4 6.9 7.4  ALBUMIN 2.9* 2.6* 2.6*  AST 182* 108* 63*  ALT 42 19 17  ALKPHOS 94 82 80  BILITOT 1.0 0.5 0.6  GFRNONAA 57* 46* 37*  GFRAA >60 53* 42*  ANIONGAP 12 11 11      Hematology Recent Labs  Lab 03/31/19 0734 04/01/19 0323 04/02/19 0200  WBC 18.0* 16.2* 17.6*  RBC 5.14 4.85 4.85  HGB 15.4 14.7 14.7  HCT 46.8 44.9 44.6  MCV 91.1 92.6 92.0  MCH 30.0 30.3 30.3  MCHC 32.9 32.7 33.0  RDW 13.6 13.9 13.9  PLT 273 271 311    BNP Recent Labs  Lab 03/28/19 2210  BNP 231.1*     DDimer  Recent Labs  Lab 03/30/19 0252 03/31/19 0734 04/01/19 0323  DDIMER 6.58* 8.20* 6.67*     Radiology    No results found.  Cardiac Studies   No new  Patient Profile     86 y.o. male Covid pneumonia, PE, on apixaban, new onset paroxysmal atrial fibrillation, currently sinus rhythm after IV amiodarone  Assessment & Plan    Paroxysmal atrial fibrillation -I will go ahead and give him amiodarone 200 mg p.o. twice daily.  Hopefully over the next month, this will be able to be discontinued if he is maintaining sinus rhythm.  Chronic anticoagulation -Continue with Eliquis which is currently being used to treat PE.  Treatment duration per primary team. -Decision will need to be made after treatment for PE whether or not to continue full anticoagulation for atrial fibrillation paroxysmal.  It is certainly plausible that his A. fib is secondary to his current hypermetabolic state with Covid as well as treatment of PE.  One could rationalize potentially discontinuing his anticoagulation if he remains in sinus rhythm.  Okay for discharge from cardiology perspective  We will sign off.   Please let us know if we can be of further assistance.  We will have follow-up in 1 month with Dr. Percival Spanish or APP      For questions or updates, please contact [facility] Please consult [email or web address] for contact info under        Signed, Candee Furbish, MD  04/02/2019, 11:39 AM

## 2019-04-02 NOTE — Progress Notes (Signed)
PROGRESS NOTE   Alexander Novak  IRS:[id number] DOB: Mar 09, 1925 DOA: 03/28/2019 PCP: Mila Palmer, MD   Brief Narrative:  86 year old with history of CAD, HTN, dementia, Parkinson's disease came to the ER with complaints of chest discomfort and cough. In-house positive screening COVID-19 03/28/2019.  Chest x-ray bilateral pulmonary infiltrates. Was diagnosed with COVID-19 pneumonia. Chest x-ray showed mild interstitial prominence. CTA showed small pulmonary embolism. Hospital course complicated by delirium resolved and new onset atrial fibrillation.  04/01/19: Patient ambulating prior to discharge, HR elevated to 150s, found to be in new atrial flutter. Already on anticoagulation, echocardiogram ordered, consulted Cardiology.   Assessment & Plan: Principal Problem:   Pulmonary embolism (HCC) Active Problems:   CORONARY ATHEROSCLEROSIS NATIVE CORONARY ARTERY   Parkinson disease (HCC)   COVID-19 virus detected   Atrial flutter (HCC)  Acute pulmonary embolism without cor pulmonale COVID-19 pneumonia/infection -Presented with chest discomfort and cough was found to have a pulmonary embolism.  -Started on Eliquis on 03/29/2019. Currently on room air. -Remdesivir-day 5/5 -p.o. Decadron 6 mg daily -day 5/10 Continue bronchodilators combivent 1 puff 3 times daily. Continue vitamin C, D3 and zinc.   Procalcitonin negative  Inflammatory markers are trending down  New atrial fibrillation/flutter - Possibly due to acute infection,electrolytes normal. Denies symptoms. - Anticoagulated on apixaban currently. - Echocardiogram ordered, TSH WNL - Has known CAD - Cardiology consulted, given 2.5 mg IV metoprolol, amiodarone--appears to have converted. Amiodarone po. Echo today  Cards recommendations for  amiodarone 200 mg p.o. twice daily.  Hopefully over the next month, this will be able to be discontinued if he is maintaining sinus rhythm.  Chronic anticoagulation -Continue with Eliquis  which is currently being used to treat PE.  Treatment duration per primary team. -Decision will need to be made after treatment for PE whether or not to continue full anticoagulation for atrial fibrillation paroxysmal.  It is certainly plausible that his A. fib is secondary to his current hypermetabolic state with Covid as well as treatment of PE.  One could rationalize potentially discontinuing his anticoagulation if he remains in sinus rhythm.  Acute metabolic encephalopathy, suspect delirium, improving.  - Discontinued restraints, would avoid as worsen delirium - Discontinue Seroquel given new atrial fibrillation, only indicated if threat to self or others. No indication for prophylaxis/treatment for delirium if only confused---increases cardiac events  Fall precautions in place Reorient as needed Seems more oriented today, asked for day and time. Wants to go home.  Parkinson's disease -Continue home Sinemet and rasagiline   Coronary artery disease -Denies any anginal symptoms. - Appreciate Cardiology recommendations re: aspirin   Acute kidney injury  With elevated BUN--suspect he is dry--push po fluids.-repeat in am.  DVT prophylaxis: [id number] Code Status: DNR  Family Communication: Wife Disposition Plan: Patient is from home. Plan is to discharge to home. Barrier to discharge: Rate control for new atrial fibrillation/flutter will await echo and cards recommendations, suspect home today or tomorrow.  Consultants:   Cards  Procedures:  ECHO  Antimicrobials: Anti-infectives (From admission, onward)   Start     Dose/Rate Route Frequency Ordered Stop   03/29/19 1000  remdesivir 100 mg in sodium chloride 0.9 % 100 mL IVPB  Status:  Discontinued     100 mg 200 mL/hr over 30 Minutes Intravenous Daily 03/29/19 0015 03/29/19 0026   03/29/19 1000  remdesivir 100 mg in sodium chloride 0.9 % 100 mL IVPB     100 mg 200 mL/hr over 30 Minutes Intravenous Daily 03/28/19 2228 04/01/19  0930   03/29/19 0030  remdesivir 200 mg in sodium chloride 0.9% 250 mL IVPB  Status:  Discontinued     200 mg 580 mL/hr over 30 Minutes Intravenous Once 03/29/19 0015 03/29/19 0026   03/28/19 2230  remdesivir 200 mg in sodium chloride 0.9% 250 mL IVPB     200 mg 580 mL/hr over 30 Minutes Intravenous Once 03/28/19 2228 03/29/19 0118     Subjective: Feels well. No complaints of pain.  Objective: Vitals:   04/02/19 0100 04/02/19 0305 04/02/19 0412 04/02/19 0904  BP: (!) 124/55 (!) 148/95 112/65 104/60  Pulse:  (!) 54 (!) 56 64  Resp: 15 17 18    Temp:   97.8 F (36.6 C)   TempSrc:   Oral   SpO2: 99% 97% 96%   Weight:      Height:        Intake/Output Summary (Last 24 hours) at 04/02/2019 1025 Last data filed at 04/02/2019 0529 Gross per 24 hour  Intake 411.51 ml  Output 700 ml  Net -288.49 ml   Filed Weights   03/28/19 1335 03/29/19 0005  Weight: 81.6 kg 80.1 kg   Examination:  General exam: Appears calm and comfortable  Respiratory system: Clear to auscultation. Respiratory effort normal. Cardiovascular system: S1 & S2 heard, RRR.  Gastrointestinal system: Abdomen is nondistended, soft and nontender.  Central nervous system: Alert and oriented. No focal neurological deficits. Extremities: Symmetric  Skin: No rashes  Data Reviewed: I have personally reviewed following labs and imaging studies  CBC: Recent Labs  Lab 03/29/19 0232 03/30/19 0252 03/31/19 0734 04/01/19 0323 04/02/19 0200  WBC 8.9 10.4 18.0* 16.2* 17.6*  NEUTROABS 5.2 8.5* 15.1* 13.4* 14.9*  HGB 12.8* 13.9 15.4 14.7 14.7  HCT 38.5* 42.1 46.8 44.9 44.6  MCV 92.5 91.3 91.1 92.6 92.0  PLT 192 238 273 271 578   Basic Metabolic Panel: Recent Labs  Lab 03/29/19 0232 03/30/19 0252 03/31/19 0734 04/01/19 0323 04/02/19 0200  NA 137 138 136 136 139  K 4.3 4.5 4.5 3.7 4.1  CL 106 105 106 102 103  CO2 23 21* 18* 23 25  GLUCOSE 101* 105* 90 98 118*  BUN 9 16 19  28* 44*  CREATININE 1.07 1.18 1.13  1.35* 1.62*  CALCIUM 8.4* 8.9 9.0 8.5* 8.8*  MG 1.9  --   --   --  2.2   GFR: Estimated Creatinine Clearance: 31.2 mL/min (A) (by C-G formula based on SCr of 1.62 mg/dL (H)). Liver Function Tests: Recent Labs  Lab 03/29/19 0232 03/30/19 0252 03/31/19 0734 04/01/19 0323 04/02/19 0200  AST 28 63* 182* 108* 63*  ALT 20 15 42 19 17  ALKPHOS 79 82 94 82 80  BILITOT 0.7 0.5 1.0 0.5 0.6  PROT 6.5 7.4 7.4 6.9 7.4  ALBUMIN 2.6* 2.9* 2.9* 2.6* 2.6*   CBG: Recent Labs  Lab 03/31/19 2055 04/01/19 1214 04/01/19 1613 04/01/19 2126 04/02/19 0736  GLUCAP 125* 97 134* 131* 88   Thyroid Function Tests: Recent Labs    04/02/19 0200  TSH 2.191   Anemia Panel: Recent Labs    04/01/19 0323 04/02/19 0200  FERRITIN 215 223   Sepsis Labs: Recent Labs  Lab 03/28/19 2139  PROCALCITON <0.10   Recent Results (from the past 240 hour(s))  Respiratory Panel by RT PCR (Flu A&B, Covid) - Nasopharyngeal Swab     Status: Abnormal   Collection Time: 03/28/19  3:37 PM   Specimen: Nasopharyngeal Swab  Result Value Ref Range  Status   SARS Coronavirus 2 by RT PCR POSITIVE (A) NEGATIVE Final    Comment: RESULT CALLED TO, READ BACK BY AND VERIFIED WITH: Therese Sarah RN 17:00 03/28/19 ([email or web address]) (NOTE) SARS-CoV-2 target nucleic acids are DETECTED. SARS-CoV-2 RNA is generally detectable in upper respiratory specimens  during the acute phase of infection. Positive results are indicative of the presence of the identified virus, but do not rule out bacterial infection or co-infection with other pathogens not detected by the test. Clinical correlation with patient history and other diagnostic information is necessary to determine patient infection status. The expected result is Negative. Fact Sheet for Patients:  https://www.moore.com/ Fact Sheet for Healthcare Providers: https://www.young.biz/ This test is not yet approved or cleared by the [place] FDA and    has been authorized for detection and/or diagnosis of SARS-CoV-2 by FDA under an Emergency Use Authorization (EUA).  This EUA will remain in effect (meaning this test can be used) fo r the duration of  the COVID-19 declaration under Section 564(b)(1) of the Act, 21 U.S.C. section 360bbb-3(b)(1), unless the authorization is terminated or revoked sooner.    Influenza A by PCR NEGATIVE NEGATIVE Final   Influenza B by PCR NEGATIVE NEGATIVE Final    Comment: (NOTE) The Xpert Xpress SARS-CoV-2/FLU/RSV assay is intended as an aid in  the diagnosis of influenza from Nasopharyngeal swab specimens and  should not be used as a sole basis for treatment. Nasal washings and  aspirates are unacceptable for Xpert Xpress SARS-CoV-2/FLU/RSV  testing. Fact Sheet for Patients: https://www.moore.com/ Fact Sheet for Healthcare Providers: https://www.young.biz/ This test is not yet approved or cleared by the [place] FDA and  has been authorized for detection and/or diagnosis of SARS-CoV-2 by  FDA under an Emergency Use Authorization (EUA). This EUA will remain  in effect (meaning this test can be used) for the duration of the  Covid-19 declaration under Section 564(b)(1) of the Act, 21  U.S.C. section 360bbb-3(b)(1), unless the authorization is  terminated or revoked. Performed at [facility], [address] [place], [place] [address]     Scheduled Meds: . apixaban  10 mg Oral BID   Followed by  . [START ON 04/05/2019] apixaban  5 mg Oral BID  . vitamin C  500 mg Oral Daily  . carbidopa-levodopa  1 tablet Oral TID AC  . cholecalciferol  1,000 Units Oral Daily  . dexamethasone  6 mg Oral Daily  . donepezil  10 mg Oral Daily  . insulin aspart  0-5 Units Subcutaneous QHS  . insulin aspart  0-9 Units Subcutaneous TID WC  . Ipratropium-Albuterol  1 puff Inhalation TID  . metoprolol tartrate  12.5 mg Oral BID  . omega-3 acid ethyl esters  1 g Oral  Daily  . ramelteon  8 mg Oral QHS  . rasagiline  1 mg Oral Daily  . simethicone  80 mg Oral TID PC  . sodium chloride flush  3 mL Intravenous Q12H  . sodium chloride flush  3 mL Intravenous Q12H  . zinc sulfate  220 mg Oral Daily   Continuous Infusions: . sodium chloride    . amiodarone Stopped (04/01/19 2342)    LOS: 4 days   Reva Bores, MD 04/02/2019 10:25 AM [phone] [facility] If 7PM-7AM, please contact night-coverage 04/02/2019, 10:25 AM

## 2019-04-02 NOTE — Progress Notes (Signed)
  Echocardiogram 2D Echocardiogram has been performed.  Delcie Roch 04/02/2019, 1:11 PM

## 2019-04-03 ENCOUNTER — Telehealth: Payer: Self-pay

## 2019-04-03 DIAGNOSIS — R001 Bradycardia, unspecified: Secondary | ICD-10-CM

## 2019-04-03 DIAGNOSIS — I2782 Chronic pulmonary embolism: Secondary | ICD-10-CM

## 2019-04-03 DIAGNOSIS — I2609 Other pulmonary embolism with acute cor pulmonale: Secondary | ICD-10-CM

## 2019-04-03 LAB — GLUCOSE, CAPILLARY
Glucose-Capillary: 80 mg/dL (ref 70–99)
Glucose-Capillary: 140 mg/dL — ABNORMAL HIGH (ref 70–99)
Glucose-Capillary: 99 mg/dL (ref 70–99)
Glucose-Capillary: 110 mg/dL — ABNORMAL HIGH (ref 70–99)

## 2019-04-03 LAB — BASIC METABOLIC PANEL
Anion gap: 9 (ref 5–15)
BUN: 49 mg/dL — ABNORMAL HIGH (ref 8–23)
CO2: 24 mmol/L (ref 22–32)
Calcium: 8.7 mg/dL — ABNORMAL LOW (ref 8.9–10.3)
Chloride: 104 mmol/L (ref 98–111)
Creatinine, Ser: 1.34 mg/dL — ABNORMAL HIGH (ref 0.61–1.24)
GFR calc Af Amer: 53 mL/min — ABNORMAL LOW (ref >60)
GFR calc non Af Amer: 46 mL/min — ABNORMAL LOW (ref >60)
Glucose, Bld: 123 mg/dL — ABNORMAL HIGH (ref 70–99)
Potassium: 3.9 mmol/L (ref 3.5–5.1)
Sodium: 137 mmol/L (ref 135–145)

## 2019-04-03 MED ORDER — IPRATROPIUM-ALBUTEROL 20-100 MCG/ACT IN AERS
1.0000 | INHALATION_SPRAY | Freq: Four times a day (QID) | RESPIRATORY_TRACT | Status: DC | PRN
  Filled 2019-04-03: qty 4

## 2019-04-03 NOTE — Telephone Encounter (Signed)
-----   Message from Rollene Rotunda, MD sent at 04/02/2019 12:51 PM EST ----- Regarding: FW: Follow-up hospital  ----- Message ----- From: Jake Bathe, MD Sent: 04/02/2019  11:44 AM EST To: Rollene Rotunda, MD, [facility] Scheduling Subject: Follow-up hospital                             Please set up follow-up appointment with Dr. Antoine Poche or APP on his team in 4 weeks.  Atrial fibrillation.  Donato Schultz, MD

## 2019-04-03 NOTE — Telephone Encounter (Signed)
Spoke with patient's wife to schedule a 4wk follow up with an APP or Dr. Antoine Poche. Patient is still in the hospital currently. Will call again once discharged.

## 2019-04-03 NOTE — Plan of Care (Signed)
Pt HR falls down into high 40s overnight while resting. Asymptomatic when awake, denies dizziness, fatigue and appears with no SOB. Pt is baseline with some confusion. Alert and oriented to self and place at this time. Provider on call notified. Will continue to monitor.

## 2019-04-03 NOTE — Progress Notes (Signed)
PROGRESS NOTE  Alexander Novak ELF:[id number] DOB: [birth year] DOA: 03/28/2019 PCP: Alexander Jordan, MD  Brief History   Pt is a 86 yr old man who presented to [facility] on 03/28/2019 with complaints of chest discomfort and cough. He was found to be in atrial flutter with HR in the 150's. He is also positive for COVID 19. His rate was controlled with IV amiodarone and oral metoprolol. IV amiodarone has now been stopped. He is now bradycardic in the 40's on metoprolol alone. Concern for sick sinus syndrome. Metoprolol held this morning due to bradycardia.   Echocardiogram was performed on 04/02/2019. It demonstrated atrial fibrillation, EF of 40-45% with mildly decreased ventricular function with global hypokinesis. There is grade 1 diastolic dysfunction. Right ventricle has normal global function.  The patient was admitted to a telemetry bed with anticoagulation due to pulmonary embolus and treatment for COVID-19 pneumonia. He has received remdesevir x 2 doses, Oral decadron, IV amiodarone which was stopped on 04/01/2019, and low dose oral metoprolol which was held this morning due to bradycardia.   Consultants  . Cardiology  Procedures  . None  Antibiotics   Anti-infectives (From admission, onward)   Start     Dose/Rate Route Frequency Ordered Stop   03/29/19 1000  remdesivir 100 mg in sodium chloride 0.9 % 100 mL IVPB  Status:  Discontinued     100 mg 200 mL/hr over 30 Minutes Intravenous Daily 03/29/19 0015 03/29/19 0026   03/29/19 1000  remdesivir 100 mg in sodium chloride 0.9 % 100 mL IVPB     100 mg 200 mL/hr over 30 Minutes Intravenous Daily 03/28/19 2228 04/01/19 0930   03/29/19 0030  remdesivir 200 mg in sodium chloride 0.9% 250 mL IVPB  Status:  Discontinued     200 mg 580 mL/hr over 30 Minutes Intravenous Once 03/29/19 0015 03/29/19 0026   03/28/19 2230  remdesivir 200 mg in sodium chloride 0.9% 250 mL IVPB     200 mg 580 mL/hr over 30 Minutes Intravenous Once 03/28/19 2228  03/29/19 0118    .   Subjective  The patient is awake, but somewhat agitated and confused. He keeps telling me that he needs his clothes so he can go to work.   Objective   Vitals:  Vitals:   04/03/19 0641 04/03/19 0855  BP:    Pulse:  (!) 49  Resp: 15   Temp:    SpO2: 100% 96%   Exam:  Constitutional:  . The patient is awake, but confused and agitated. No acute distress. Respiratory:  . No increased work of breathing. . No wheezes, rales, or rhonchi . No tactile fremitus Cardiovascular:  . Irregular rate and rhythm. Bradycardic . No murmurs, ectopy, or gallups. . No lateral PMI. No thrills. Abdomen:  . Abdomen is soft, non-tender, non-distended . No hernias, masses, or organomegaly . Normoactive bowel sounds.  Musculoskeletal:  . No cyanosis, clubbing, or edema Skin:  . No rashes, lesions, ulcers . palpation of skin: no induration or nodules Neurologic:  . CN 2-12 intact . Sensation all 4 extremities intact Psychiatric: Confused and agitated. Poor insight and judgement.  I have personally reviewed the following:   Today's Data  . Vitals, BMP  Micro Data  . COVID + 03/28/2019  Imaging  . CT angiogram of the chest: Pulmonary emboli  Cardiology Data  . EKG, Echocardiogram  Scheduled Meds: . apixaban  10 mg Oral BID   Followed by  . [START ON 04/05/2019] apixaban  5 mg  Oral BID  . vitamin C  500 mg Oral Daily  . carbidopa-levodopa  1 tablet Oral TID AC  . cholecalciferol  1,000 Units Oral Daily  . dexamethasone  6 mg Oral Daily  . donepezil  10 mg Oral Daily  . insulin aspart  0-5 Units Subcutaneous QHS  . insulin aspart  0-9 Units Subcutaneous TID WC  . metoprolol tartrate  12.5 mg Oral BID  . omega-3 acid ethyl esters  1 g Oral Daily  . ramelteon  8 mg Oral QHS  . rasagiline  1 mg Oral Daily  . simethicone  80 mg Oral TID PC  . sodium chloride flush  3 mL Intravenous Q12H  . sodium chloride flush  3 mL Intravenous Q12H  . zinc sulfate  220 mg  Oral Daily   Continuous Infusions: . sodium chloride    . amiodarone Stopped (04/01/19 2342)    Principal Problem:   Pulmonary embolism (HCC) Active Problems:   CORONARY ATHEROSCLEROSIS NATIVE CORONARY ARTERY   Parkinson disease (HCC)   COVID-19 virus detected   Atrial flutter (HCC)   LOS: 5 days   A & P  COVID-19 pneumonia/infection: The patient tested positive on 03/28/2019. He has received 5/5 days of Remdesivir. He is also on day 5/10 of oral decadron. He is also receiving bronchodilators by inhaler tid and he is being supplemented with Vit C, D3, and Zinc. Inflammatory markers are trending down. Procalcitonin is negative.  Acute pulmonary embolism without cor pulmonale: Pt presented with chest discomfort and cough was found to have a pulmonary embolism on the left. He is being treated with oral Eliquis.   Acute hypoxic respiratory failure: Resolved. Pt is now saturating in the 90's on room air.  New atrial fibrillation/flutter/concern for sick sinus: Initially successfully treated with IV amiodarone with low dose metoprolol. Amiodarone stopped yesterday. Pt with HR in the 50's this morning. Metoprolol held. Continue to monitor over the next 24 hours. Cardiology has signed off, but patient may require PPM to allow for treatment of fast rates. Will check EKG again. The patient does have known CAD. TSH WNL. Echocardiogram was performed on 04/02/2019. It has demonstrated  atrial fibrillation, EF of 40-45% with mildly decreased ventricular function with global hypokinesis. There is grade 1 diastolic dysfunction. Right ventricle has normal global function.  Chronic anticoagulation:  Continue with Eliquis which is currently being used to treat PE. Treatment duration per primary team. Decision will need to be made after treatment for PE whether or not to continue full anticoagulation for atrial fibrillation paroxysmal. It is certainly plausible that his A. fib is secondary to his current  hypermetabolic state with Covid as well as treatment of PE. Onecould rationalize potentially discontinuing his anticoagulation if he remains in sinus rhythm, once he is no longer hypercoaguable due to COVID19  Acute metabolic encephalopathy, suspect delirium: Unknown if improving. Clearly agitated and confused this am. Likely at least in part due to steroids. Will restart seroquel only if  threat to self or others. Fall precautions in place. Reorient as needed. The sooner he can be discharged to home the better for his mental status.   Parkinson's disease: Continue home Sinemetand rasagiline.  Coronary artery disease: Denies any anginal symptoms. Appreciate Cardiology recommendations re: aspirin.  Acute kidney injury: Creatinine slowly improving. BUN remains high. Dehydration.   DVT prophylaxis: [id number] Code Status: DNR  Family Communication: Wife Disposition Plan: Discharge to home when meds to control heart rate are better understood.  Marrion Finan, DO  [facility] Direct contact: see [email or web address]  7PM-7AM contact night coverage as above 04/03/2019, 2:06 PM  LOS: 5 days

## 2019-04-04 ENCOUNTER — Ambulatory Visit: Payer: Medicare Other | Admitting: Neurology

## 2019-04-04 ENCOUNTER — Other Ambulatory Visit: Payer: Self-pay

## 2019-04-04 DIAGNOSIS — I4892 Unspecified atrial flutter: Secondary | ICD-10-CM

## 2019-04-04 LAB — GLUCOSE, CAPILLARY
Glucose-Capillary: 84 mg/dL (ref 70–99)
Glucose-Capillary: 91 mg/dL (ref 70–99)
Glucose-Capillary: 118 mg/dL — ABNORMAL HIGH (ref 70–99)
Glucose-Capillary: 130 mg/dL — ABNORMAL HIGH (ref 70–99)

## 2019-04-04 LAB — CBC WITH DIFFERENTIAL/PLATELET
Abs Immature Granulocytes: 0.09 10*3/uL — ABNORMAL HIGH (ref 0.00–0.07)
Basophils Absolute: 0 10*3/uL (ref 0.0–0.1)
Basophils Relative: 0 %
Eosinophils Absolute: 0 10*3/uL (ref 0.0–0.5)
Eosinophils Relative: 0 %
HCT: 42.6 % (ref 39.0–52.0)
Hemoglobin: 14 g/dL (ref 13.0–17.0)
Immature Granulocytes: 1 %
Lymphocytes Relative: 9 %
Lymphs Abs: 1.3 10*3/uL (ref 0.7–4.0)
MCH: 30.2 pg (ref 26.0–34.0)
MCHC: 32.9 g/dL (ref 30.0–36.0)
MCV: 91.8 fL (ref 80.0–100.0)
Monocytes Absolute: 1.1 10*3/uL — ABNORMAL HIGH (ref 0.1–1.0)
Monocytes Relative: 8 %
Neutro Abs: 12.4 10*3/uL — ABNORMAL HIGH (ref 1.7–7.7)
Neutrophils Relative %: 82 %
Platelets: 349 10*3/uL (ref 150–400)
RBC: 4.64 MIL/uL (ref 4.22–5.81)
RDW: 14.1 % (ref 11.5–15.5)
WBC: 15 10*3/uL — ABNORMAL HIGH (ref 4.0–10.5)
nRBC: 0 % (ref 0.0–0.2)

## 2019-04-04 LAB — BASIC METABOLIC PANEL
Anion gap: 11 (ref 5–15)
BUN: 38 mg/dL — ABNORMAL HIGH (ref 8–23)
CO2: 21 mmol/L — ABNORMAL LOW (ref 22–32)
Calcium: 8.6 mg/dL — ABNORMAL LOW (ref 8.9–10.3)
Chloride: 106 mmol/L (ref 98–111)
Creatinine, Ser: 1.13 mg/dL (ref 0.61–1.24)
GFR calc Af Amer: >60 mL/min (ref >60)
GFR calc non Af Amer: 57 mL/min — ABNORMAL LOW (ref >60)
Glucose, Bld: 98 mg/dL (ref 70–99)
Potassium: 3.7 mmol/L (ref 3.5–5.1)
Sodium: 138 mmol/L (ref 135–145)

## 2019-04-04 MED ORDER — AMLODIPINE BESYLATE 10 MG PO TABS
10.0000 mg | ORAL_TABLET | Freq: Every day | ORAL | Status: DC
  Administered 2019-04-04 – 2019-04-05 (×2): 10 mg via ORAL
  Filled 2019-04-04 (×2): qty 1

## 2019-04-04 MED ORDER — AMIODARONE HCL 200 MG PO TABS: 200.0000 mg | ORAL_TABLET | Freq: Two times a day (BID) | ORAL | Status: DC

## 2019-04-04 NOTE — Progress Notes (Signed)
Brief cardiology update:  Asked to comment as patient has HR around 50 now. No indication for pacemaker at this time. IV amiodarone does have more heart rate effect than oral. Would recommend changing per Dr. Anne Fu' recommendation 04/02/19 to amiodarone 200 mg oral BID until follow up. Continue apixaban. Does not need metoprolol added at this time, can be reassessed in the future if amiodarone is discontinued.  He is now in a sinus bradycardia with occasional ectopy. No high degree block seen.  See Dr. Anne Fu prior note for sign off. He is having outpatient follow up arranged. Please call with any additional questions.  Jodelle Red, MD, PhD [facility]  [address], [address] [place], [place] [address] [phone]

## 2019-04-04 NOTE — Progress Notes (Addendum)
PROGRESS NOTE  Alexander Novak JQZ:[id number] DOB: 1925-11-29 DOA: 03/28/2019 PCP: Alexander Jordan, MD  Brief History   Pt is a 86 yr old man who presented to [facility] on 03/28/2019 with complaints of chest discomfort and cough. He was found to be in atrial flutter with HR in the 150's. He is also positive for COVID 19. His rate was controlled with IV amiodarone and oral metoprolol. IV amiodarone has now been stopped. He is now bradycardic in the 40's on metoprolol alone. Concern for sick sinus syndrome. Metoprolol held this morning due to bradycardia.   Echocardiogram was performed on 04/02/2019. It demonstrated atrial fibrillation, EF of 40-45% with mildly decreased ventricular function with global hypokinesis. There is grade 1 diastolic dysfunction. Right ventricle has normal global function.  The patient was admitted to a telemetry bed with anticoagulation due to pulmonary embolus and treatment for COVID-19 pneumonia. He has received remdesevir x 2 doses, Oral decadron, IV amiodarone which was stopped on 04/01/2019, and low dose oral metoprolol which was held this morning due to bradycardia. Cardiology has been asked to come back for recommendations. I will start oral amiodarone as they recommend.  Consultants  . Cardiology  Procedures  . None  Antibiotics   Anti-infectives (From admission, onward)   Start     Dose/Rate Route Frequency Ordered Stop   03/29/19 1000  remdesivir 100 mg in sodium chloride 0.9 % 100 mL IVPB  Status:  Discontinued     100 mg 200 mL/hr over 30 Minutes Intravenous Daily 03/29/19 0015 03/29/19 0026   03/29/19 1000  remdesivir 100 mg in sodium chloride 0.9 % 100 mL IVPB     100 mg 200 mL/hr over 30 Minutes Intravenous Daily 03/28/19 2228 04/01/19 0930   03/29/19 0030  remdesivir 200 mg in sodium chloride 0.9% 250 mL IVPB  Status:  Discontinued     200 mg 580 mL/hr over 30 Minutes Intravenous Once 03/29/19 0015 03/29/19 0026   03/28/19 2230  remdesivir 200 mg in  sodium chloride 0.9% 250 mL IVPB     200 mg 580 mL/hr over 30 Minutes Intravenous Once 03/28/19 2228 03/29/19 0118      Subjective  The patient is awake, agitated and confused.   Objective   Vitals:  Vitals:   04/04/19 0500 04/04/19 1236  BP: (!) 112/54 117/71  Pulse: (!) 54 62  Resp: 18 18  Temp: 98.3 F (36.8 C) 97.7 F (36.5 C)  SpO2: 98% 97%   Exam:  Constitutional:  . The patient is awake, but confused and agitated. No acute distress. Respiratory:  . No increased work of breathing. . No wheezes, rales, or rhonchi . No tactile fremitus Cardiovascular:  . Irregular rate and rhythm. Bradycardic . No murmurs, ectopy, or gallups. . No lateral PMI. No thrills. Abdomen:  . Abdomen is soft, non-tender, non-distended . No hernias, masses, or organomegaly . Normoactive bowel sounds.  Musculoskeletal:  . No cyanosis, clubbing, or edema Skin:  . No rashes, lesions, ulcers . palpation of skin: no induration or nodules Neurologic:  . CN 2-12 intact . Sensation all 4 extremities intact Psychiatric: Confused and agitated. Poor insight and judgement.  I have personally reviewed the following:   Today's Data  . Vitals, BMP, CBC  Micro Data  . COVID + 03/28/2019  Imaging  . CT angiogram of the chest: Pulmonary emboli  Cardiology Data  . EKG, Echocardiogram  Scheduled Meds: . amiodarone  200 mg Oral BID  . amLODipine  10 mg Oral  Daily  . apixaban  10 mg Oral BID   Followed by  . [START ON 04/05/2019] apixaban  5 mg Oral BID  . vitamin C  500 mg Oral Daily  . carbidopa-levodopa  1 tablet Oral TID AC  . cholecalciferol  1,000 Units Oral Daily  . dexamethasone  6 mg Oral Daily  . donepezil  10 mg Oral Daily  . insulin aspart  0-5 Units Subcutaneous QHS  . insulin aspart  0-9 Units Subcutaneous TID WC  . omega-3 acid ethyl esters  1 g Oral Daily  . ramelteon  8 mg Oral QHS  . rasagiline  1 mg Oral Daily  . simethicone  80 mg Oral TID PC  . sodium chloride  flush  3 mL Intravenous Q12H  . sodium chloride flush  3 mL Intravenous Q12H  . zinc sulfate  220 mg Oral Daily   Continuous Infusions: . sodium chloride      Principal Problem:   Pulmonary embolism (HCC) Active Problems:   CORONARY ATHEROSCLEROSIS NATIVE CORONARY ARTERY   Parkinson disease (HCC)   COVID-19 virus detected   Atrial flutter (HCC)   LOS: 6 days   A & P  COVID-19 pneumonia/infection: The patient tested positive on 03/28/2019. He has received 5/5 days of Remdesivir. He is also on day 5/10 of oral decadron. He is also receiving bronchodilators by inhaler tid and he is being supplemented with Vit C, D3, and Zinc. Inflammatory markers are trending down. Procalcitonin is negative.  Acute pulmonary embolism without cor pulmonale: Pt presented with chest discomfort and cough was found to have a pulmonary embolism on the left. He is being treated with oral Eliquis.   Acute hypoxic respiratory failure: Resolved. Pt is now saturating in the 90's on room air.  New atrial fibrillation/flutter/concern for sick sinus: Initially successfully treated with IV amiodarone with low dose metoprolol. Amiodarone stopped 04/01/2019. Pt with HR in the 50's this for the last 2 days. Metoprolol held. Cardiology has signed off, but out of concern that the patient may require PPM to allow for treatment of fast rates. The patient was re-evaluated by Alexander Red, MD. She agrees that it is reasonable that the patient be discharged to home on no rate limiting medications given his heart rate is in the 50's and he is now in sinus rhythm as per repeat EKG. The patient does have known CAD. TSH WNL. Echocardiogram was performed on 04/02/2019. It has demonstrated  atrial fibrillation, EF of 40-45% with mildly decreased ventricular function with global hypokinesis. There is grade 1 diastolic dysfunction. Right ventricle has normal global function. The patient will follow up with Dr. Antoine Novak as  outpatient.  Chronic anticoagulation:  Continue with Eliquis which is currently being used to treat PE. Decision will need to be made after treatment for PE whether or not to continue full anticoagulation for atrial fibrillation paroxysmal especially since the patient is now in sinus rhythm. It is certainly plausible that his A. fib is secondary to his current hypermetabolic state with COVID as well as treatment of PE. Onecould rationalize potentially discontinuing his anticoagulation if he remains in sinus rhythm, once he is no longer hypercoaguable due to COVID19.  Acute metabolic encephalopathy, suspect delirium: Unknown if improving. Clearly agitated and confused this am. Likely at least in part due to steroids. Will restart seroquel only if  threat to self or others. Fall precautions in place. Reorient as needed. The sooner he can be discharged to home the better for his mental  status.   Parkinson's disease: Continue home Sinemetand rasagiline.  Coronary artery disease: Denies any anginal symptoms. Appreciate Cardiology recommendations re: aspirin. He will follow up with Dr. Antoine Novak as oupatient.  Acute kidney injury: Creatinine slowly improving. BUN remains high. Dehydration.   I have seen and examined this patient myself. I have spent 35 minutes in her evaluation and care.  DVT prophylaxis: [id number] Code Status: DNR  Family Communication: Wife Disposition Plan: Patient is from home. He will be discharged to Home with Home health if heart rate remains stable. Yarixa Lightcap, DO [facility] Direct contact: see [email or web address]  7PM-7AM contact night coverage as above 04/04/2019, 2:52 PM  LOS: 5 days

## 2019-04-05 LAB — GLUCOSE, CAPILLARY
Glucose-Capillary: 90 mg/dL (ref 70–99)
Glucose-Capillary: 110 mg/dL — ABNORMAL HIGH (ref 70–99)

## 2019-04-05 MED ORDER — VITAMIN D3 25 MCG PO TABS: 1000.0000 [IU] | ORAL_TABLET | Freq: Every day | ORAL | 0 refills | Status: AC

## 2019-04-05 MED ORDER — AMLODIPINE BESYLATE 10 MG PO TABS: 10.0000 mg | ORAL_TABLET | Freq: Every day | ORAL | 0 refills | Status: AC

## 2019-04-05 MED ORDER — RAMELTEON 8 MG PO TABS: 8.0000 mg | ORAL_TABLET | Freq: Every day | ORAL | 0 refills | Status: AC

## 2019-04-05 MED ORDER — ZINC SULFATE 220 (50 ZN) MG PO CAPS: 220.0000 mg | ORAL_CAPSULE | Freq: Every day | ORAL | 0 refills | Status: AC

## 2019-04-05 MED FILL — AMLODIPINE BESYLATE 10 MG T: 10 | 30 days supply | Qty: 30 | Fill #0

## 2019-04-05 MED FILL — ZINC SULFATE 220 MG TABLET: 220 (50 ZN) | 30 days supply | Qty: 30 | Fill #0

## 2019-04-05 MED FILL — VITAMIN D3 1,000 UNIT TAB: 25 MCG | 16 days supply | Qty: 16 | Fill #0

## 2019-04-05 NOTE — Progress Notes (Signed)
Alexander Novak to be D/C'd Home per MD order.  Discussed with the patient's wife and all questions fully answered.  VSS, Skin clean, dry and intact without evidence of skin break down, no evidence of skin tears noted. IV catheter discontinued intact. Site without signs and symptoms of complications. Dressing and pressure applied.  An After Visit Summary was printed and given to the patient. D/c education completed with patient/family including follow up instructions, medication list, d/c activities limitations if indicated, with other d/c instructions as indicated by MD - patient able to verbalize understanding, all questions fully answered.   Patient instructed to return to ED, call 911, or call MD for any changes in condition.   Patient escorted via WC, and D/C home via private auto.  Quincy Carnes 04/05/2019 3:22 PM

## 2019-04-05 NOTE — Discharge Summary (Signed)
Physician Discharge Summary  Alexander Novak BRA:[id number] DOB: 1925-12-19 DOA: 03/28/2019  PCP: Alexander Palmer, MD  Admit date: 03/28/2019 Discharge date: 04/05/2019  Recommendations for Outpatient Follow-up:  1. Isolation to continue through 04/18/2019 2. Isolation guidelines given 3. Patient may get COVID-19 vaccine on 05/02/2019. 4. He is to follow up with PCP in 7-10 days. 5. He is to follow up with Dr. Antoine Poche in one month.  Follow-up Information    [facility] Follow up.   Why: home health        Alexander Palmer, MD Follow up.   Specialty: Family Medicine Contact information: [address] [place] [address] [phone]        Alexander Rotunda, MD Follow up.   Specialty: Cardiology Contact information: [address] [place] [address] [phone]          Discharge Diagnoses: Principal diagnosis is #1 1. COVID 19 infection 2. Acute pulmonary embolus 3. Acute hypoxic respiratory failure 4. Atrial fibrillation/flutter 5. Anticoagulation 6. Acute metabolic encephalopathy/delirium 7. Parkinson's disease 8. Coronary artery disease 9. AKI  Discharge Condition: Fair  Disposition: Home  Diet recommendation: Heart healthy  Filed Weights   03/28/19 1335 03/29/19 0005  Weight: 81.6 kg 80.1 kg    History of present illness:  Alexander Novak is a 86 y.o. male with medical history significant for CAD, hypertension, Parkinson's disease, and memory loss, now presenting to the emergency department with 3 days of chest pain and cough.  Patient's wife assists with the history.  Patient been in his usual state until 3 days ago when he developed a cough that has been frequent and nonproductive, and has also been complaining of episodic chest pain.  He has not complained of fevers or chills and there has not been any leg swelling or tenderness.    ED Course: Upon arrival to the ED, patient is found to be afebrile,  saturating low 90s on room air, slightly tachypneic, and with stable blood pressure.  EKG features sinus rhythm.  Chest x-ray with increased interstitial prominence.  Chemistry panel and CBC unremarkable, high-sensitivity troponin normal x2, D-dimer elevated to 6.64, and Covid PCR is positive.  CTA chest is notable for small PE.  Hospitalist consulted for admission.  Hospital Course:  Pt is a 86 yr old man who presented to [facility] on 03/28/2019 with complaints of chest discomfort and cough. He was found to be in atrial flutter with HR in the 150's. He is also positive for COVID 19. His rate was controlled with IV amiodarone and oral metoprolol. IV amiodarone has now been stopped. He is now bradycardic in the 40's on metoprolol alone. Concern for sick sinus syndrome. Metoprolol held this morning due to bradycardia.   Echocardiogram was performed on 04/02/2019. It demonstrated atrial fibrillation, EF of 40-45% with mildly decreased ventricular function with global hypokinesis. There is grade 1 diastolic dysfunction. Right ventricle has normal global function.  The patient was admitted to a telemetry bed with anticoagulation due to pulmonary embolus and treatment for COVID-19 pneumonia. He has received remdesevir x 2 doses, Oral decadron, IV amiodarone which was stopped on 04/01/2019, and low dose oral metoprolol which was held this morning due to bradycardia. Cardiology has been asked to come back for recommendations. I will start oral amiodarone as they recommend.  Today's assessment: S: The patient is awake and alert. No new complaints. He is anxious to go home. O: Vitals:  Vitals:   04/05/19 0010 04/05/19  0500  BP: 116/70 111/60  Pulse: 61 64  Resp:    Temp: 98 F (36.7 C) 98.3 F (36.8 C)  SpO2:  98%   Exam:  Constitutional:   The patient is awake, but confused and agitated. No acute distress. Respiratory:   No increased work of breathing.  No wheezes, rales, or rhonchi  No tactile  fremitus Cardiovascular:   Irregular rate and rhythm. Bradycardic  No murmurs, ectopy, or gallups.  No lateral PMI. No thrills. Abdomen:   Abdomen is soft, non-tender, non-distended  No hernias, masses, or organomegaly  Normoactive bowel sounds.  Musculoskeletal:   No cyanosis, clubbing, or edema Skin:   No rashes, lesions, ulcers  palpation of skin: no induration or nodules Neurologic:   CN 2-12 intact  Sensation all 4 extremities intact Psychiatric: Confused and agitated. Poor insight and judgement.  I have personally reviewed the following:   Discharge Instructions  Discharge Instructions    Activity as tolerated - No restrictions   Complete by: As directed    Call MD for:  persistant nausea and vomiting   Complete by: As directed    Call MD for:  severe uncontrolled pain   Complete by: As directed    Diet - low sodium heart healthy   Complete by: As directed    Soft diet.   Discharge instructions   Complete by: As directed    Isolation to continue at home through 04/18/2019  Person Under Monitoring Name: Alexander Novak  Location: [address] [place] [address]   Infection Prevention Recommendations for Individuals Confirmed to have, or Being Evaluated for, 2019 Novel Coronavirus (COVID-19) Infection Who Receive Care at Home  Individuals who are confirmed to have, or are being evaluated for, COVID-19 should follow the prevention steps below until a healthcare provider or local or state health department says they can return to normal activities.  Stay home except to get medical care You should restrict activities outside your home, except for getting medical care. Do not go to work, school, or public areas, and do not use public transportation or taxis.  Call ahead before visiting your doctor Before your medical appointment, call the healthcare provider and tell them that you have, or are being evaluated for, COVID-19 infection.  This will help the healthcare provider's office take steps to keep other people from getting infected. Ask your healthcare provider to call the local or state health department.  Monitor your symptoms Seek prompt medical attention if your illness is worsening (e.g., difficulty breathing). Before going to your medical appointment, call the healthcare provider and tell them that you have, or are being evaluated for, COVID-19 infection. Ask your healthcare provider to call the local or state health department.  Wear a facemask You should wear a facemask that covers your nose and mouth when you are in the same room with other people and when you visit a healthcare provider. People who live with or visit you should also wear a facemask while they are in the same room with you.  Separate yourself from other people in your home As much as possible, you should stay in a different room from other people in your home. Also, you should use a separate bathroom, if available.  Avoid sharing household items You should not share dishes, drinking glasses, cups, eating utensils, towels, bedding, or other items with other people in your home. After using these items, you should wash them thoroughly with soap and water.  Cover your  coughs and sneezes Cover your mouth and nose with a tissue when you cough or sneeze, or you can cough or sneeze into your sleeve. Throw used tissues in a lined trash can, and immediately wash your hands with soap and water for at least 20 seconds or use an alcohol-based hand rub.  Wash your [employer] your hands often and thoroughly with soap and water for at least 20 seconds. You can use an alcohol-based hand sanitizer if soap and water are not available and if your hands are not visibly dirty. Avoid touching your eyes, nose, and mouth with unwashed hands.   Prevention Steps for Caregivers and Household Members of Individuals Confirmed to have, or Being Evaluated for,  COVID-19 Infection Being Cared for in the Home  If you live with, or provide care at home for, a person confirmed to have, or being evaluated for, COVID-19 infection please follow these guidelines to prevent infection:  Follow healthcare provider's instructions Make sure that you understand and can help the patient follow any healthcare provider instructions for all care.  Provide for the patient's basic needs You should help the patient with basic needs in the home and provide support for getting groceries, prescriptions, and other personal needs.  Monitor the patient's symptoms If they are getting sicker, call his or her medical provider and tell them that the patient has, or is being evaluated for, COVID-19 infection. This will help the healthcare provider's office take steps to keep other people from getting infected. Ask the healthcare provider to call the local or state health department.  Limit the number of people who have contact with the patient If possible, have only one caregiver for the patient. Other household members should stay in another home or place of residence. If this is not possible, they should stay in another room, or be separated from the patient as much as possible. Use a separate bathroom, if available. Restrict visitors who do not have an essential need to be in the home.  Keep older adults, very young children, and other sick people away from the patient Keep older adults, very young children, and those who have compromised immune systems or chronic health conditions away from the patient. This includes people with chronic heart, lung, or kidney conditions, diabetes, and cancer.  Ensure good ventilation Make sure that shared spaces in the home have good air flow, such as from an air conditioner or an opened window, weather permitting.  Wash your hands often Wash your hands often and thoroughly with soap and water for at least 20 seconds. You can use an  alcohol based hand sanitizer if soap and water are not available and if your hands are not visibly dirty. Avoid touching your eyes, nose, and mouth with unwashed hands. Use disposable paper towels to dry your hands. If not available, use dedicated cloth towels and replace them when they become wet.  Wear a facemask and gloves Wear a disposable facemask at all times in the room and gloves when you touch or have contact with the patient's blood, body fluids, and/or secretions or excretions, such as sweat, saliva, sputum, nasal mucus, vomit, urine, or feces.  Ensure the mask fits over your nose and mouth tightly, and do not touch it during use. Throw out disposable facemasks and gloves after using them. Do not reuse. Wash your hands immediately after removing your facemask and gloves. If your personal clothing becomes contaminated, carefully remove clothing and launder. Wash your hands after handling contaminated clothing.  Place all used disposable facemasks, gloves, and other waste in a lined container before disposing them with other household waste. Remove gloves and wash your hands immediately after handling these items.  Do not share dishes, glasses, or other household items with the patient Avoid sharing household items. You should not share dishes, drinking glasses, cups, eating utensils, towels, bedding, or other items with a patient who is confirmed to have, or being evaluated for, COVID-19 infection. After the person uses these items, you should wash them thoroughly with soap and water.  Wash laundry thoroughly Immediately remove and wash clothes or bedding that have blood, body fluids, and/or secretions or excretions, such as sweat, saliva, sputum, nasal mucus, vomit, urine, or feces, on them. Wear gloves when handling laundry from the patient. Read and follow directions on labels of laundry or clothing items and detergent. In general, wash and dry with the warmest temperatures recommended  on the label.  Clean all areas the individual has used often Clean all touchable surfaces, such as counters, tabletops, doorknobs, bathroom fixtures, toilets, phones, keyboards, tablets, and bedside tables, every day. Also, clean any surfaces that may have blood, body fluids, and/or secretions or excretions on them. Wear gloves when cleaning surfaces the patient has come in contact with. Use a diluted bleach solution (e.g., dilute bleach with 1 part bleach and 10 parts water) or a household disinfectant with a label that says EPA-registered for coronaviruses. To make a bleach solution at home, add 1 tablespoon of bleach to 1 quart (4 cups) of water. For a larger supply, add  cup of bleach to 1 gallon (16 cups) of water. Read labels of cleaning products and follow recommendations provided on product labels. Labels contain instructions for safe and effective use of the cleaning product including precautions you should take when applying the product, such as wearing gloves or eye protection and making sure you have good ventilation during use of the product. Remove gloves and wash hands immediately after cleaning.  Monitor yourself for signs and symptoms of illness Caregivers and household members are considered close contacts, should monitor their health, and will be asked to limit movement outside of the home to the extent possible. Follow the monitoring steps for close contacts listed on the symptom monitoring form.   ? If you have additional questions, contact your local health department or call the epidemiologist on call at [phone] (available 24/7). ? This guidance is subject to change. For the most up-to-date guidance from [facility], please refer to their website: TripMetro.hu  Patient may receive vaccine for COVID-19 on 05/02/2019  Patient is to follow up with PCP in 7-10 days.   Increase activity slowly   Complete by: As  directed      Allergies as of 04/05/2019   No Known Allergies     Medication List    STOP taking these medications   Magnesium 250 MG Tabs     TAKE these medications   amLODipine 10 MG tablet Commonly known as: NORVASC Take 1 tablet (10 mg total) by mouth daily. Start taking on: April 06, 2019   apixaban 5 MG Tabs tablet Commonly known as: ELIQUIS Take TWO tablets TWICE a day for 5 days then take one tablet TWICE per day   aspirin EC 81 MG tablet Take 81 mg by mouth daily.   B Complete Tabs Take 1 tablet by mouth daily.   carbidopa-levodopa 25-100 MG tablet Commonly known as: SINEMET IR Take 1 tablet by mouth 3 (three) times daily.  What changed: when to take this   CARNITINE PO Take 500 mg by mouth daily.   Co Q10 100 MG Caps Take 100 mg by mouth daily.   dexamethasone 6 MG tablet Commonly known as: DECADRON Take 1 tablet (6 mg total) by mouth daily.   donepezil 10 MG tablet Commonly known as: ARICEPT Take 1 tablet (10 mg total) by mouth daily.   fish oil-omega-3 fatty acids 1000 MG capsule Take 2 g by mouth daily.   Ginkgo 60 MG Tabs Take 120 mg by mouth 2 (two) times daily.   NON FORMULARY Take 1 capsule by mouth See admin instructions. Procera Advanced Brain - 3-in-1 Nootropic Brain Supplement  Memory & Mood Support w/Energy Vitamins  Ashwagandha, Rhodiola, Ginseng, Ginkgo, Phosphatidylserine & Vitamin B Complex: Take 1 capsule by mouth once a day   OVER THE COUNTER MEDICATION Take 1 tablet by mouth See admin instructions. [facility] sleeping tablets (generic Unisom, Benadryl, or Melatonin??): Take 1 tablet by mouth at bedtime as needed for sleep   ramelteon 8 MG tablet Commonly known as: ROZEREM Take 1 tablet (8 mg total) by mouth at bedtime.   rasagiline 1 MG Tabs tablet Commonly known as: Azilect Take 1 tablet (1 mg total) by mouth daily.   simethicone 80 MG chewable tablet Commonly known as: MYLICON Chew 80 mg by mouth 3 (three) times daily  after meals.   simvastatin 40 MG tablet Commonly known as: ZOCOR TAKE 1 TABLET BY MOUTH EVERY DAY   UNABLE TO FIND Take 1 capsule by mouth See admin instructions. Med Name: Immune Health Support capsule: Take 1 capsule by mouth once a day   vitamin C 1000 MG tablet Take 1,000 mg by mouth 2 (two) times daily.   Vitamin D3 25 MCG tablet Commonly known as: Vitamin D Take 1 tablet (1,000 Units total) by mouth daily. Start taking on: April 06, 2019   vitamin E 180 MG (400 UNITS) capsule Take 400 Units by mouth daily.   zinc sulfate 220 (50 Zn) MG capsule Take 1 capsule (220 mg total) by mouth daily. Start taking on: April 06, 2019      No Known Allergies  The results of significant diagnostics from this hospitalization (including imaging, microbiology, ancillary and laboratory) are listed below for reference.    Significant Diagnostic Studies: DG Chest 2 View  Result Date: 03/28/2019 CLINICAL DATA:  Chest pain EXAM: CHEST - 2 VIEW COMPARISON:  2014 FINDINGS: Increased chronic interstitial prominence. No pleural effusion or pneumothorax. Possible pleural calcification at the left lung base. Cardiomediastinal contours are within normal limits with normal heart size. There is mild calcified plaque along the thoracic aorta. Surgical clips overlie the upper abdomen. No acute osseous abnormality. IMPRESSION: Increased interstitial prominence, which may reflect interstitial lung disease. Electronically Signed   By: Guadlupe SpanishPraneil  Patel M.D.   On: 03/28/2019 14:10   CT Angio Chest PE W/Cm &/Or Wo Cm  Result Date: 03/28/2019 CLINICAL DATA:  Intermittent chest pain and elevated D-dimer. EXAM: CT ANGIOGRAPHY CHEST WITH CONTRAST TECHNIQUE: Multidetector CT imaging of the chest was performed using the standard protocol during bolus administration of intravenous contrast. Multiplanar CT image reconstructions and MIPs were obtained to evaluate the vascular anatomy. CONTRAST:  80mL OMNIPAQUE IOHEXOL  350 MG/ML SOLN COMPARISON:  None. FINDINGS: Cardiovascular: There is moderate to marked severity calcification of the thoracic aorta. A very small amount of intraluminal low attenuation is seen within and upper lobe branch of the left pulmonary artery (axial CT image 43,  CT series number 5/sagittal reformatted image 127, CT series number 9). Normal heart size. No pericardial effusion. Marked severity coronary artery calcification is seen. Mediastinum/Nodes: No enlarged mediastinal, hilar, or axillary lymph nodes. Thyroid gland, trachea, and esophagus demonstrate no significant findings. Lungs/Pleura: Mild emphysematous lung disease is seen involving the bilateral upper lobes. Mild to moderate severity patchy areas of atelectasis and/or infiltrate are seen along the periphery of both lungs. There is no evidence of a pleural effusion or pneumothorax. Upper Abdomen: Numerous subcentimeter gallstones are seen within the lumen of an otherwise normal-appearing gallbladder. Musculoskeletal: Multilevel degenerative changes are seen throughout the thoracic spine Review of the MIP images confirms the above findings. IMPRESSION: 1. Very small amount of pulmonary embolism seen involving an upper lobe branch of the left pulmonary artery. 2. Mild to moderate severity patchy bilateral atelectasis and/or infiltrates. 3. Cholelithiasis. Electronically Signed   By: Aram Candela M.D.   On: 03/28/2019 21:29   ECHOCARDIOGRAM LIMITED  Result Date: 04/02/2019   ECHOCARDIOGRAM LIMITED REPORT   Patient Name:   GERAD CORNELIO [facility] Date of Exam: 04/02/2019 Medical Rec #:  [id number]        Height:       70.5 in Accession #:    [id number]       Weight:       176.6 lb Date of Birth:  09/07/1925        BSA:          1.99 m Patient Age:    86 years         BP:           142/90 mmHg Patient Gender: M                HR:           56 bpm. Exam Location:  Inpatient  Procedure: Limited Echo, Limited Color Doppler and Cardiac Doppler Indications:     Atrial fibrillation 427.31  History:        Patient has no prior history of Echocardiogram examinations.  Sonographer:    Delcie Roch Referring Phys: [id number] CARINA M BROWN IMPRESSIONS  1. Left ventricular ejection fraction, by visual estimation, is 40 to 45%. The left ventricle has mildly decreased function.  2. Elevated left atrial pressure.  3. Left ventricular diastolic parameters are consistent with Grade I diastolic dysfunction (impaired relaxation).  4. The left ventricle demonstrates global hypokinesis.  5. Global right ventricle has normal systolc function.The right ventricular size is normal. no increase in right ventricular wall thickness.  6. Mild mitral annular calcification.  7. The mitral valve is normal in structure. Trivial mitral valve regurgitation. No evidence of mitral stenosis.  8. The tricuspid valve was normal in structure. Tricuspid valve regurgitation is trivial.  9. Tricuspid valve regurgitation is trivial. 10. Normal pulmonary artery systolic pressure. 11. The inferior vena cava is normal in size with greater than 50% respiratory variability, suggesting right atrial pressure of 3 mmHg. FINDINGS  Left Ventricle: Left ventricular ejection fraction, by visual estimation, is 40 to 45%. The left ventricle has mildly decreased function. The left ventricle demonstrates global hypokinesis. Left ventricular diastolic parameters are consistent with Grade  I diastolic dysfunction (impaired relaxation). Elevated left atrial pressure. There is moderate focal basal septal hypertrophy. Right Ventricle: The right ventricular size is normal. No increase in right ventricular wall thickness. Global RV systolic function is has normal systolic function. The tricuspid regurgitant velocity is 2.44 m/s, and with an assumed right atrial pressure  of 3 mmHg, the estimated right ventricular systolic pressure is normal at 26.8 mmHg. Pericardium: There is no evidence of pericardial effusion is seen. There is  no evidence of pericardial effusion. Mitral Valve: The mitral valve is normal in structure. There is mild thickening of the mitral valve leaflet(s). Mild mitral annular calcification. No evidence of mitral valve stenosis by observation. MV Area by PHT, 3.85 cm. MV PHT, 57.13 msec. Trivial mitral valve regurgitation. Tricuspid Valve: The tricuspid valve is normal in structure. Tricuspid valve regurgitation is trivial. Aortic Valve: Aortic valve mean gradient measures 6.0 mmHg. Aortic valve peak gradient measures 12.3 mmHg. Aortic valve area, by VTI measures 1.62 cm. There is mild thickening of the aortic valve. There is mild calcification of the aortic valve. Mild aortic valve annular calcification. Pulmonic Valve: The pulmonic valve was not well visualized. Pulmonic valve regurgitation is not visualized by color flow Doppler. Pulmonic regurgitation is not visualized by color flow Doppler. No evidence of pulmonic stenosis. Pulmonary Artery: Indeterminant PASP, inadequate TR jet. Venous: The inferior vena cava is normal in size with greater than 50% respiratory variability, suggesting right atrial pressure of 3 mmHg. Shunts: No atrial level shunt detected by color flow Doppler.  LEFT VENTRICLE          Normals PLAX 2D LVIDd:         5.40 cm  3.6 cm   Diastology                 Normals LVIDs:         4.30 cm  1.7 cm   LV e' lateral:   7.40 cm/s 6.42 cm/s LV PW:         1.00 cm  1.4 cm   LV E/e' lateral: 11.8      15.4 LV IVS:        0.80 cm  1.3 cm   LV e' medial:    3.92 cm/s 6.96 cm/s LVOT diam:     2.00 cm  2.0 cm   LV E/e' medial:  22.2      6.96 LV SV:         58 ml    79 ml LV SV Index:   29.07    45 ml/m2 LVOT Area:     3.14 cm 3.14 cm2  LEFT ATRIUM         Index LA diam:    4.10 cm 2.06 cm/m  AORTIC VALVE                    Normals AV Area (Vmax):    1.62 cm AV Area (Vmean):   1.45 cm     3.06 cm2 AV Area (VTI):     1.62 cm AV Vmax:           175.42 cm/s AV Vmean:          117.224 cm/s 77 cm/s AV VTI:             0.384 m      3.15 cm2 AV Peak Grad:      12.3 mmHg AV Mean Grad:      6.0 mmHg     3 mmHg LVOT Vmax:         90.54 cm/s LVOT Vmean:        54.165 cm/s  75 cm/s LVOT VTI:          0.198 m      25.3 cm LVOT/AV VTI ratio: 0.52  1  AORTA                 Normals Ao Root diam: 3.60 cm 31 mm MITRAL VALVE              Normals    TRICUSPID VALVE             Normals MV Area (PHT): 3.85 cm              TR Peak grad:   23.8 mmHg MV PHT:        57.13 msec 55 ms      TR Vmax:        244.00 cm/s 288 cm/s MV Decel Time: 197 msec   187 ms MV E velocity: 87.00 cm/s  103 cm/s  SHUNTS MV A velocity: 100.00 cm/s 70.3 cm/s Systemic VTI:  0.20 m MV E/A ratio:  0.87        1.5       Systemic Diam: 2.00 cm  Dina Rich MD Electronically signed by Dina Rich MD Signature Date/Time: 04/02/2019/1:32:19 PMThe mitral valve is normal in structure.    Final     Microbiology: Recent Results (from the past 240 hour(s))  Respiratory Panel by RT PCR (Flu A&B, Covid) - Nasopharyngeal Swab     Status: Abnormal   Collection Time: 03/28/19  3:37 PM   Specimen: Nasopharyngeal Swab  Result Value Ref Range Status   SARS Coronavirus 2 by RT PCR POSITIVE (A) NEGATIVE Final    Comment: RESULT CALLED TO, READ BACK BY AND VERIFIED WITH: Therese Sarah RN 17:00 03/28/19 ([email or web address]) (NOTE) SARS-CoV-2 target nucleic acids are DETECTED. SARS-CoV-2 RNA is generally detectable in upper respiratory specimens  during the acute phase of infection. Positive results are indicative of the presence of the identified virus, but do not rule out bacterial infection or co-infection with other pathogens not detected by the test. Clinical correlation with patient history and other diagnostic information is necessary to determine patient infection status. The expected result is Negative. Fact Sheet for Patients:  https://www.moore.com/ Fact Sheet for Healthcare Providers: https://www.young.biz/ This test is  not yet approved or cleared by the [place] FDA and  has been authorized for detection and/or diagnosis of SARS-CoV-2 by FDA under an Emergency Use Authorization (EUA).  This EUA will remain in effect (meaning this test can be used) fo r the duration of  the COVID-19 declaration under Section 564(b)(1) of the Act, 21 U.S.C. section 360bbb-3(b)(1), unless the authorization is terminated or revoked sooner.    Influenza A by PCR NEGATIVE NEGATIVE Final   Influenza B by PCR NEGATIVE NEGATIVE Final    Comment: (NOTE) The Xpert Xpress SARS-CoV-2/FLU/RSV assay is intended as an aid in  the diagnosis of influenza from Nasopharyngeal swab specimens and  should not be used as a sole basis for treatment. Nasal washings and  aspirates are unacceptable for Xpert Xpress SARS-CoV-2/FLU/RSV  testing. Fact Sheet for Patients: https://www.moore.com/ Fact Sheet for Healthcare Providers: https://www.young.biz/ This test is not yet approved or cleared by the [place] FDA and  has been authorized for detection and/or diagnosis of SARS-CoV-2 by  FDA under an Emergency Use Authorization (EUA). This EUA will remain  in effect (meaning this test can be used) for the duration of the  Covid-19 declaration under Section 564(b)(1) of the Act, 21  U.S.C. section 360bbb-3(b)(1), unless the authorization is  terminated or revoked. Performed at [facility], [address] [place], [place] [address]  Labs: Basic Metabolic Panel: Recent Labs  Lab 03/31/19 0734 04/01/19 0323 04/02/19 0200 04/03/19 0240 04/04/19 0633  NA 136 136 139 137 138  K 4.5 3.7 4.1 3.9 3.7  CL 106 102 103 104 106  CO2 18* 23 25 24  21*  GLUCOSE 90 98 118* 123* 98  BUN 19 28* 44* 49* 38*  CREATININE 1.13 1.35* 1.62* 1.34* 1.13  CALCIUM 9.0 8.5* 8.8* 8.7* 8.6*  MG  --   --  2.2  --   --    Liver Function Tests: Recent Labs  Lab 03/30/19 0252 03/31/19 0734 04/01/19 0323  04/02/19 0200  AST 63* 182* 108* 63*  ALT 15 42 19 17  ALKPHOS 82 94 82 80  BILITOT 0.5 1.0 0.5 0.6  PROT 7.4 7.4 6.9 7.4  ALBUMIN 2.9* 2.9* 2.6* 2.6*   No results for input(s): LIPASE, AMYLASE in the last 168 hours. No results for input(s): AMMONIA in the last 168 hours. CBC: Recent Labs  Lab 03/30/19 0252 03/31/19 0734 04/01/19 0323 04/02/19 0200 04/04/19 0633  WBC 10.4 18.0* 16.2* 17.6* 15.0*  NEUTROABS 8.5* 15.1* 13.4* 14.9* 12.4*  HGB 13.9 15.4 14.7 14.7 14.0  HCT 42.1 46.8 44.9 44.6 42.6  MCV 91.3 91.1 92.6 92.0 91.8  PLT 238 273 271 311 349   Cardiac Enzymes: No results for input(s): CKTOTAL, CKMB, CKMBINDEX, TROPONINI in the last 168 hours. BNP: BNP (last 3 results) Recent Labs    03/28/19 2210  BNP 231.1*    ProBNP (last 3 results) No results for input(s): PROBNP in the last 8760 hours.  CBG: Recent Labs  Lab 04/04/19 0753 04/04/19 1142 04/04/19 1655 04/04/19 2030 04/05/19 0748  GLUCAP 84 91 118* 130* 90    Principal Problem:   Pulmonary embolism (HCC) Active Problems:   CORONARY ATHEROSCLEROSIS NATIVE CORONARY ARTERY   Parkinson disease ([place])   COVID-19 virus detected   Atrial flutter ([place])  Time coordinating discharge: 38 minutes  Signed:        Nuno Brubacher, DO [facility]  04/05/2019, 11:29 AM

## 2019-04-05 NOTE — Care Management Important Message (Signed)
Important Message  Patient Details  Name: Alexander Novak MRN: [id number] Date of Birth: 04-03-1925   Medicare Important Message Given:  Yes - Important Message mailed due to current National Emergency  Verbal consent obtained due to current National Emergency  Relationship to patient: Spouse/Significant Other Contact Name: Bhavesh Vazquez Call Date: 04/05/19  Time: 1142 Phone: [phone] Outcome: Spoke with contact Important Message mailed to: Patient address on file    Orson Aloe 04/05/2019, 11:42 AM

## 2019-04-13 ENCOUNTER — Telehealth: Payer: Self-pay | Admitting: *Deleted

## 2019-04-13 MED ORDER — DONEPEZIL HCL 10 MG PO TABS: 10.0000 mg | ORAL_TABLET | Freq: Every day | ORAL | 0 refills | Status: DC

## 2019-04-13 MED ORDER — DONEPEZIL HCL 10 MG PO TABS: 10.0000 mg | ORAL_TABLET | Freq: Every day | ORAL | 0 refills | Status: AC

## 2019-04-13 MED ORDER — RASAGILINE MESYLATE 1 MG PO TABS: 1.0000 mg | ORAL_TABLET | Freq: Every day | ORAL | 0 refills | Status: DC

## 2019-04-13 MED ORDER — CARBIDOPA-LEVODOPA 25-100 MG PO TABS: 1.0000 | ORAL_TABLET | Freq: Three times a day (TID) | ORAL | 0 refills | Status: AC

## 2019-04-13 MED ORDER — RASAGILINE MESYLATE 1 MG PO TABS: 1.0000 mg | ORAL_TABLET | Freq: Every day | ORAL | 0 refills | Status: AC

## 2019-04-13 MED ORDER — CARBIDOPA-LEVODOPA 25-100 MG PO TABS: 1.0000 | ORAL_TABLET | Freq: Three times a day (TID) | ORAL | 0 refills | Status: DC

## 2019-04-13 NOTE — Telephone Encounter (Addendum)
Refilled Donepezil, carb/levo, rasagiline  x 3 months with note to pharmacy: needs to schedule FU. Patient recently in hospital, positive Covid, missed FU.

## 2019-04-13 NOTE — Telephone Encounter (Signed)
All three Rx discontinued with [facility], refilled x 3 months with [facility] mail service.

## 2019-04-13 NOTE — Addendum Note (Signed)
Addended by: [place] Pink on: 04/13/2019 10:34 AM   Modules accepted: Orders

## 2019-04-13 NOTE — Telephone Encounter (Signed)
Cancel Refill for [facility] all other refills to be sent to [facility]

## 2019-04-13 NOTE — Addendum Note (Signed)
Addended by: Colen Darling C on: 04/13/2019 11:04 AM   Modules accepted: Orders

## 2019-04-24 ENCOUNTER — Telehealth: Payer: Self-pay | Admitting: Neurology

## 2019-04-24 NOTE — Telephone Encounter (Signed)
Patient wife called stating that his parkinson's and dementia is worsening. FYI only schedule an apt for this upcoming Thursday

## 2019-04-27 ENCOUNTER — Ambulatory Visit: Payer: Medicare Other | Admitting: Neurology

## 2019-05-02 ENCOUNTER — Ambulatory Visit: Payer: Medicare Other | Admitting: General Practice

## 2019-05-11 ENCOUNTER — Telehealth: Payer: Self-pay | Admitting: Nurse Practitioner

## 2019-05-11 NOTE — Telephone Encounter (Signed)
FYI Pt wife has called to report pt passed on May 06, 2022

## 2019-06-01 DEATH — deceased

## 2020-06-19 IMAGING — DX DG CHEST 2V
2 series · 2 of 2 positions shown · non-contrast
Comparison: 1413

CLINICAL DATA: Chest pain

EXAM:
CHEST - 2 VIEW

[w chest pa]
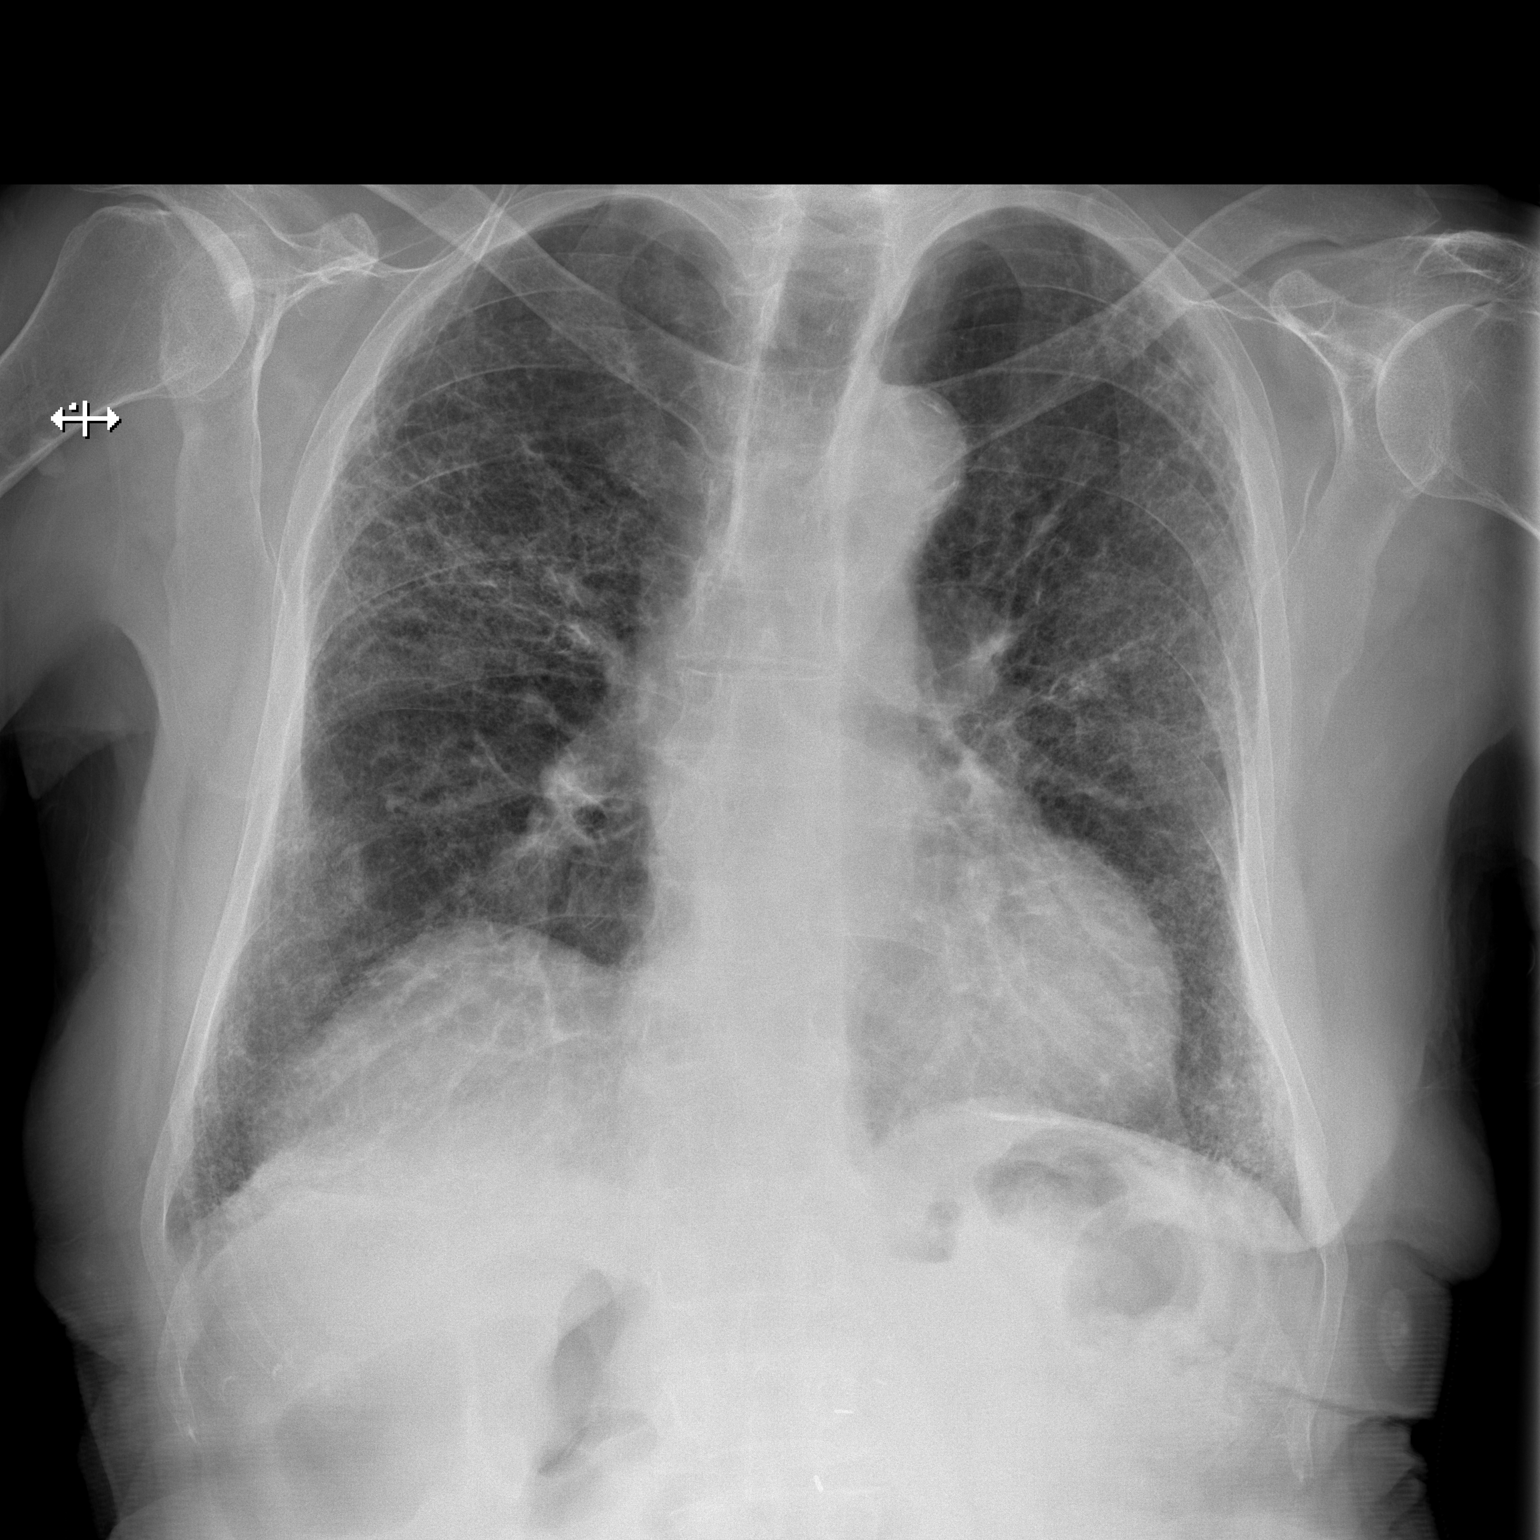

[w chest lat]
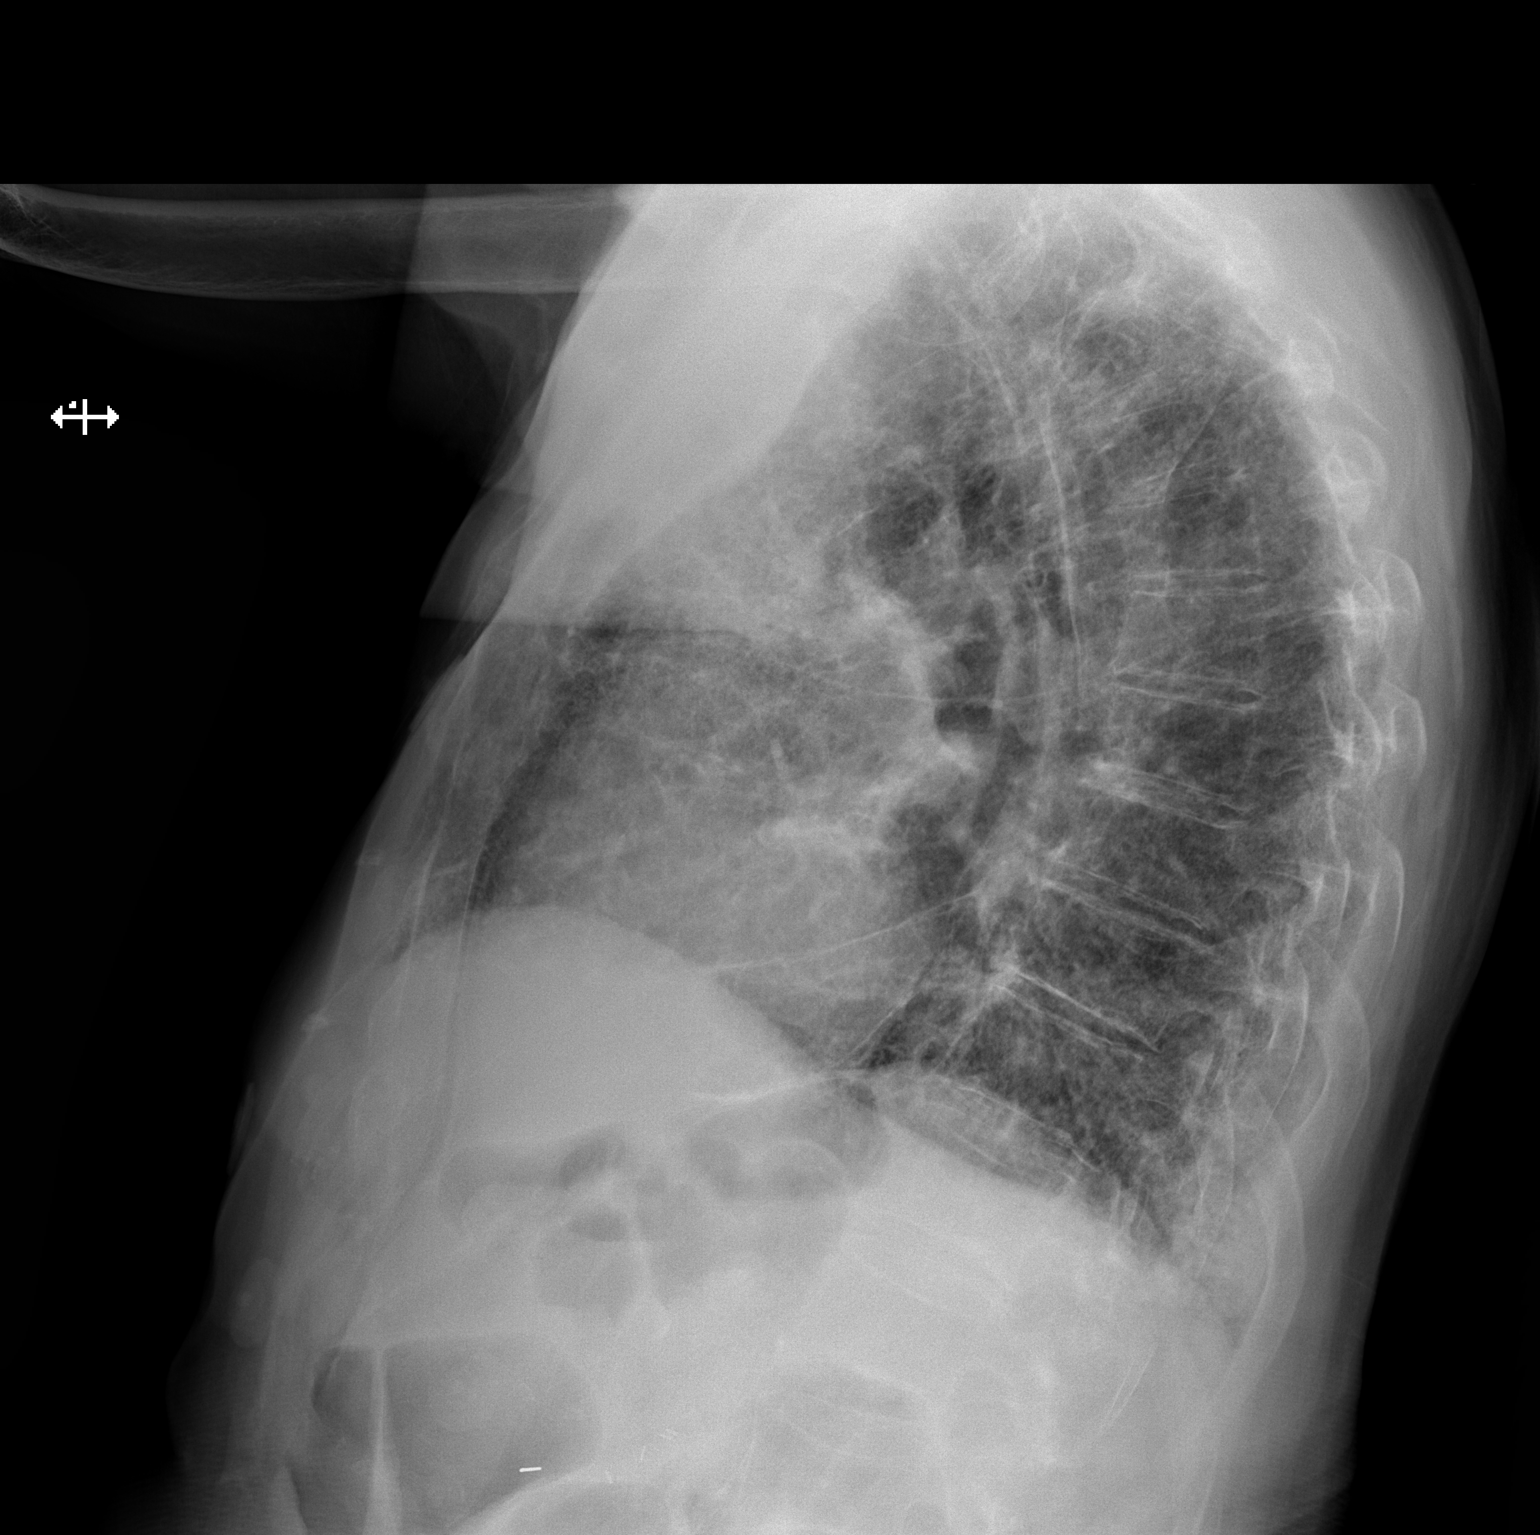

[2 of 2 positions shown; findings below may reference images not displayed]

FINDINGS: Increased chronic interstitial prominence. No pleural effusion or
pneumothorax. Possible pleural calcification at the left lung base.
Cardiomediastinal contours are within normal limits with normal
heart size. There is mild calcified plaque along the thoracic aorta.
Surgical clips overlie the upper abdomen. No acute osseous
abnormality.
IMPRESSION: Increased interstitial prominence, which may reflect interstitial
lung disease.
